# Patient Record
Sex: Female | Born: 1955 | Race: White | Hispanic: No | Marital: Married | Smoking: Never smoker
Health system: Southern US, Community
[De-identification: ages and names within clinical notes are randomized; demographics above are authoritative.]

## PROBLEM LIST (undated history)

## (undated) DIAGNOSIS — C439 Malignant melanoma of skin, unspecified: Secondary | ICD-10-CM

## (undated) DIAGNOSIS — D219 Benign neoplasm of connective and other soft tissue, unspecified: Secondary | ICD-10-CM

## (undated) DIAGNOSIS — E785 Hyperlipidemia, unspecified: Secondary | ICD-10-CM

## (undated) DIAGNOSIS — F419 Anxiety disorder, unspecified: Secondary | ICD-10-CM

## (undated) DIAGNOSIS — M1990 Unspecified osteoarthritis, unspecified site: Secondary | ICD-10-CM

## (undated) DIAGNOSIS — D649 Anemia, unspecified: Secondary | ICD-10-CM

## (undated) DIAGNOSIS — T7840XA Allergy, unspecified, initial encounter: Secondary | ICD-10-CM

## (undated) DIAGNOSIS — C4491 Basal cell carcinoma of skin, unspecified: Secondary | ICD-10-CM

## (undated) DIAGNOSIS — B029 Zoster without complications: Secondary | ICD-10-CM

## (undated) DIAGNOSIS — C801 Malignant (primary) neoplasm, unspecified: Secondary | ICD-10-CM

## (undated) DIAGNOSIS — Z86010 Personal history of colonic polyps: Secondary | ICD-10-CM

## (undated) DIAGNOSIS — C4492 Squamous cell carcinoma of skin, unspecified: Secondary | ICD-10-CM

## (undated) DIAGNOSIS — E109 Type 1 diabetes mellitus without complications: Secondary | ICD-10-CM

## (undated) DIAGNOSIS — I10 Essential (primary) hypertension: Secondary | ICD-10-CM

## (undated) HISTORY — DX: Malignant (primary) neoplasm, unspecified: C80.1

## (undated) HISTORY — DX: Allergy, unspecified, initial encounter: T78.40XA

## (undated) HISTORY — DX: Malignant melanoma of skin, unspecified: C43.9

## (undated) HISTORY — PX: COLONOSCOPY: SHX174

## (undated) HISTORY — PX: FOOT SURGERY: SHX648

## (undated) HISTORY — DX: Anxiety disorder, unspecified: F41.9

## (undated) HISTORY — PX: DILATION AND CURETTAGE OF UTERUS: SHX78

## (undated) HISTORY — PX: OTHER SURGICAL HISTORY: SHX169

## (undated) HISTORY — DX: Hyperlipidemia, unspecified: E78.5

## (undated) HISTORY — DX: Benign neoplasm of connective and other soft tissue, unspecified: D21.9

## (undated) HISTORY — DX: Unspecified osteoarthritis, unspecified site: M19.90

## (undated) HISTORY — DX: Essential (primary) hypertension: I10

## (undated) HISTORY — DX: Zoster without complications: B02.9

## (undated) HISTORY — PX: BREAST EXCISIONAL BIOPSY: SUR124

## (undated) HISTORY — DX: Personal history of colonic polyps: Z86.010

## (undated) HISTORY — DX: Anemia, unspecified: D64.9

## (undated) HISTORY — DX: Type 1 diabetes mellitus without complications: E10.9

## (undated) HISTORY — PX: TONSILLECTOMY AND ADENOIDECTOMY: SUR1326

---
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
## 1998-10-12 ENCOUNTER — Encounter: Payer: Self-pay | Admitting: *Deleted

## 1998-10-12 ENCOUNTER — Ambulatory Visit (HOSPITAL_COMMUNITY): Admission: RE | Admit: 1998-10-12 | Discharge: 1998-10-12 | Payer: Self-pay | Admitting: *Deleted

## 1999-12-27 ENCOUNTER — Other Ambulatory Visit: Admission: RE | Admit: 1999-12-27 | Discharge: 1999-12-27 | Payer: Self-pay | Admitting: *Deleted

## 2000-02-07 ENCOUNTER — Ambulatory Visit (HOSPITAL_COMMUNITY): Admission: RE | Admit: 2000-02-07 | Discharge: 2000-02-07 | Payer: Self-pay | Admitting: *Deleted

## 2000-02-07 ENCOUNTER — Encounter: Payer: Self-pay | Admitting: *Deleted

## 2000-10-14 ENCOUNTER — Encounter: Admission: RE | Admit: 2000-10-14 | Discharge: 2001-01-12 | Payer: Self-pay | Admitting: Internal Medicine

## 2001-02-07 ENCOUNTER — Encounter: Payer: Self-pay | Admitting: *Deleted

## 2001-02-07 ENCOUNTER — Ambulatory Visit (HOSPITAL_COMMUNITY): Admission: RE | Admit: 2001-02-07 | Discharge: 2001-02-07 | Payer: Self-pay | Admitting: *Deleted

## 2001-09-11 ENCOUNTER — Other Ambulatory Visit: Admission: RE | Admit: 2001-09-11 | Discharge: 2001-09-11 | Payer: Self-pay | Admitting: *Deleted

## 2002-07-08 ENCOUNTER — Ambulatory Visit (HOSPITAL_COMMUNITY): Admission: RE | Admit: 2002-07-08 | Discharge: 2002-07-08 | Payer: Self-pay | Admitting: *Deleted

## 2002-07-08 ENCOUNTER — Encounter: Payer: Self-pay | Admitting: *Deleted

## 2003-05-05 ENCOUNTER — Other Ambulatory Visit: Admission: RE | Admit: 2003-05-05 | Discharge: 2003-05-05 | Payer: Self-pay | Admitting: *Deleted

## 2003-05-17 ENCOUNTER — Encounter: Admission: RE | Admit: 2003-05-17 | Discharge: 2003-05-17 | Payer: Self-pay | Admitting: *Deleted

## 2004-03-14 ENCOUNTER — Ambulatory Visit: Payer: Self-pay | Admitting: Internal Medicine

## 2004-08-15 ENCOUNTER — Encounter: Admission: RE | Admit: 2004-08-15 | Discharge: 2004-08-15 | Payer: Self-pay | Admitting: *Deleted

## 2004-12-14 ENCOUNTER — Other Ambulatory Visit: Admission: RE | Admit: 2004-12-14 | Discharge: 2004-12-14 | Payer: Self-pay | Admitting: *Deleted

## 2005-10-30 ENCOUNTER — Encounter: Admission: RE | Admit: 2005-10-30 | Discharge: 2005-10-30 | Payer: Self-pay | Admitting: Obstetrics and Gynecology

## 2006-02-18 ENCOUNTER — Other Ambulatory Visit: Admission: RE | Admit: 2006-02-18 | Discharge: 2006-02-18 | Payer: Self-pay | Admitting: Obstetrics & Gynecology

## 2006-11-11 ENCOUNTER — Encounter: Admission: RE | Admit: 2006-11-11 | Discharge: 2006-11-11 | Payer: Self-pay | Admitting: Obstetrics & Gynecology

## 2007-06-19 ENCOUNTER — Other Ambulatory Visit: Admission: RE | Admit: 2007-06-19 | Discharge: 2007-06-19 | Payer: Self-pay | Admitting: Obstetrics & Gynecology

## 2007-11-12 ENCOUNTER — Ambulatory Visit (HOSPITAL_BASED_OUTPATIENT_CLINIC_OR_DEPARTMENT_OTHER): Admission: RE | Admit: 2007-11-12 | Discharge: 2007-11-12 | Payer: Self-pay | Admitting: Obstetrics & Gynecology

## 2008-08-02 ENCOUNTER — Other Ambulatory Visit: Admission: RE | Admit: 2008-08-02 | Discharge: 2008-08-02 | Payer: Self-pay | Admitting: Obstetrics & Gynecology

## 2008-11-17 ENCOUNTER — Ambulatory Visit (HOSPITAL_BASED_OUTPATIENT_CLINIC_OR_DEPARTMENT_OTHER): Admission: RE | Admit: 2008-11-17 | Discharge: 2008-11-17 | Payer: Self-pay | Admitting: Obstetrics & Gynecology

## 2008-11-17 ENCOUNTER — Ambulatory Visit: Payer: Self-pay | Admitting: Radiology

## 2009-01-27 DIAGNOSIS — E7800 Pure hypercholesterolemia, unspecified: Secondary | ICD-10-CM | POA: Insufficient documentation

## 2009-01-27 DIAGNOSIS — F419 Anxiety disorder, unspecified: Secondary | ICD-10-CM | POA: Insufficient documentation

## 2011-05-02 ENCOUNTER — Encounter: Payer: Self-pay | Admitting: Internal Medicine

## 2012-01-23 ENCOUNTER — Encounter: Payer: Self-pay | Admitting: Internal Medicine

## 2012-03-14 DIAGNOSIS — Z860101 Personal history of adenomatous and serrated colon polyps: Secondary | ICD-10-CM | POA: Insufficient documentation

## 2012-03-14 DIAGNOSIS — Z86010 Personal history of colonic polyps: Secondary | ICD-10-CM

## 2012-03-14 HISTORY — DX: Personal history of adenomatous and serrated colon polyps: Z86.0101

## 2012-03-14 HISTORY — DX: Personal history of colonic polyps: Z86.010

## 2014-12-29 DIAGNOSIS — D126 Benign neoplasm of colon, unspecified: Secondary | ICD-10-CM | POA: Insufficient documentation

## 2015-02-16 ENCOUNTER — Telehealth: Payer: Self-pay | Admitting: Internal Medicine

## 2015-02-16 NOTE — Telephone Encounter (Signed)
Rec'd from [facility] forward 9 pages to Dr.Gessner

## 2015-02-18 ENCOUNTER — Telehealth: Payer: Self-pay | Admitting: Internal Medicine

## 2015-02-18 NOTE — Telephone Encounter (Signed)
Rec'd from Dr. Debby Bud forward 9 pages to Dr.Gessner

## 2015-03-07 ENCOUNTER — Ambulatory Visit (INDEPENDENT_AMBULATORY_CARE_PROVIDER_SITE_OTHER): Payer: BC Managed Care – PPO | Admitting: Obstetrics and Gynecology

## 2015-03-07 ENCOUNTER — Encounter: Payer: Self-pay | Admitting: Obstetrics and Gynecology

## 2015-03-07 VITALS — BP 140/66 | HR 78 | Resp 16 | Ht 64.75 in | Wt 177.0 lb

## 2015-03-07 DIAGNOSIS — N951 Menopausal and female climacteric states: Secondary | ICD-10-CM | POA: Diagnosis not present

## 2015-03-07 DIAGNOSIS — E109 Type 1 diabetes mellitus without complications: Secondary | ICD-10-CM | POA: Insufficient documentation

## 2015-03-07 DIAGNOSIS — I10 Essential (primary) hypertension: Secondary | ICD-10-CM | POA: Insufficient documentation

## 2015-03-07 DIAGNOSIS — Z01419 Encounter for gynecological examination (general) (routine) without abnormal findings: Secondary | ICD-10-CM

## 2015-03-07 NOTE — Progress Notes (Signed)
59 y.o. G0P0000 MarriedCaucasianF here for annual exam.  The patient had an ultrasound last year, endometrial stripe was 8.3 mm, no bleeding. No bleeding since 2012. Not sexually active, husband is quadraplegic (since 78). She has some hot flashes, worse in the last year, mostly tolerable. She doesn't sleep well, doesn't want sleeping pills, needs to be able to help her husband.  Type 1 diabetic, diagnosed at 61. Last HgbA1C was 8.1 (this is the best it has been in a while).     Patient's last menstrual period was 03/31/2011.          Sexually active: No.  The current method of family planning is post menopausal status.    Exercising: Yes.    Walking Smoker:  no  Health Maintenance: Pap:  12/15/13 Neg. HR HPV:neg History of abnormal Pap:  no MMG:  01/2014 fatty nodules. Due now Colonoscopy:  2013. Due now   BMD:   Never TDaP:  UTD   reports that she has never smoked. She has never used smokeless tobacco. She reports that she drinks about 4.2 oz of alcohol per week. She reports that she does not use illicit drugs.She drinks a glass of wine a day. 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 Husband has been laid off. He is a quadraplegic (since she met him), she is his caregiver.   Past Medical History  Diagnosis Date  . Fibroid   . Diabetes mellitus type 1 ([place])     age 89  . Hypertension     Past Surgical History  Procedure Laterality Date  . Dilation and curettage of uterus    . Tonsillectomy and adenoidectomy    . Foot surgery Left age 24    Current Outpatient Prescriptions  Medication Sig Dispense Refill  . aspirin 81 MG tablet Take 81 mg by mouth daily.    . carvedilol (COREG) 6.25 MG tablet Take 6.25 mg by mouth 2 (two) times daily with a meal.    . celecoxib (CELEBREX) 200 MG capsule Take 200 mg by mouth daily.    . hydroxychloroquine (PLAQUENIL) 200 MG tablet Take 200 mg by mouth 2 (two) times daily.     . insulin lispro (HUMALOG) 100 UNIT/ML injection  Inject into the skin 3 (three) times daily before meals.    Marland Kitchen L-Methylfolate-B6-B12 (METANX PO) Take by mouth daily.    Marland Kitchen losartan-hydrochlorothiazide (HYZAAR) 50-12.5 MG tablet Take 1 tablet by mouth daily.    . simvastatin (ZOCOR) 40 MG tablet Take 40 mg by mouth daily.     No current facility-administered medications for this visit.  Plaquenil for "swelling in the tendons"  Family History  Problem Relation Age of Onset  . Breast cancer Mother   . Breast cancer Maternal Aunt   Mom with breast cancer in her 73's, Maunt in her 33's. Both are alive and in their 36's  Review of Systems  Endocrine: Positive for cold intolerance and heat intolerance.  All other systems reviewed and are negative.   Exam:   BP 140/66 mmHg  Pulse 78  Resp 16  Ht 5' 4.75" (1.645 m)  Wt 177 lb (80.287 kg)  BMI 29.67 kg/m2  LMP 03/31/2011  Weight change: _0 @ Height:   Height: 5' 4.75" (164.5 cm)  Ht Readings from Last 3 Encounters:  03/07/15 5' 4.75" (1.645 m)    General appearance: alert, cooperative and appears stated age Head: Normocephalic, without obvious abnormality, atraumatic Neck: no adenopathy, supple, symmetrical, trachea midline and thyroid normal  to inspection and palpation Lungs: clear to auscultation bilaterally Breasts: normal appearance, no masses or tenderness Heart: regular rate and rhythm Abdomen: soft, non-tender; bowel sounds normal; no masses,  no organomegaly Extremities: extremities normal, atraumatic, no cyanosis or edema Skin: Skin color, texture, turgor normal. No rashes or lesions Lymph nodes: Cervical, supraclavicular, and axillary nodes normal. No abnormal inguinal nodes palpated Neurologic: Grossly normal   Pelvic: External genitalia:  no lesions              Urethra:  normal appearing urethra with no masses, tenderness or lesions              Bartholins and Skenes: normal                 Vagina: normal appearing vagina with normal color and discharge,  no lesions. Very atrophic              Cervix: no lesions               Bimanual Exam:  Uterus:  normal size, contour, position, consistency, mobility, non-tender              Adnexa: no mass, fullness, tenderness               Rectovaginal: Confirms               Anus:  normal sphincter tone, no lesions  Chaperone was present for exam.  A:  Well Woman with normal exam  Vasomotor symptoms    P:   No pap needed  Mammogram  Colonoscopy due this year, has an appointment  Labs and immunizations with her primary  Discussed breast self exams  Was told to stop calcium supplements, will check her vit D with primary  Discussed behavioral methods to handle vasomotor symptoms, can try [employer]

## 2015-03-07 NOTE — Patient Instructions (Signed)

## 2015-03-10 ENCOUNTER — Other Ambulatory Visit: Payer: Self-pay

## 2015-03-26 DIAGNOSIS — H269 Unspecified cataract: Secondary | ICD-10-CM | POA: Insufficient documentation

## 2015-04-21 ENCOUNTER — Telehealth: Payer: Self-pay

## 2015-04-21 NOTE — Telephone Encounter (Signed)
Left message for patient to call me. Per Dr Carlean Purl after reviewing her records from Dr Debby Bud in [facility] her recall for a colonoscopy will be Nov. 2018.  She has an appointment with Dr Carlean Purl on 05/10/2015 which we can cancel unless she has more issues.

## 2015-05-04 ENCOUNTER — Other Ambulatory Visit: Payer: Self-pay | Admitting: Obstetrics and Gynecology

## 2015-05-04 DIAGNOSIS — Z1231 Encounter for screening mammogram for malignant neoplasm of breast: Secondary | ICD-10-CM

## 2015-05-04 NOTE — Telephone Encounter (Signed)
Left patient to call me as she may not need her appointment next week.

## 2015-05-06 NOTE — Telephone Encounter (Signed)
Reached patient and she was informed of the new colon recall date.  She is not having any issues so we have cancelled her 05/10/2015 appointment with Dr Carlean Purl.

## 2015-05-10 ENCOUNTER — Ambulatory Visit: Payer: Self-pay | Admitting: Internal Medicine

## 2015-05-12 ENCOUNTER — Other Ambulatory Visit: Payer: Self-pay | Admitting: Obstetrics and Gynecology

## 2015-05-12 ENCOUNTER — Ambulatory Visit (HOSPITAL_BASED_OUTPATIENT_CLINIC_OR_DEPARTMENT_OTHER)
Admission: RE | Admit: 2015-05-12 | Discharge: 2015-05-12 | Disposition: A | Discharge status: Home / Self Care | Payer: BC Managed Care – PPO | Source: Ambulatory Visit | Attending: Obstetrics and Gynecology | Admitting: Obstetrics and Gynecology

## 2015-05-12 DIAGNOSIS — Z1231 Encounter for screening mammogram for malignant neoplasm of breast: Secondary | ICD-10-CM | POA: Diagnosis present

## 2015-05-13 ENCOUNTER — Ambulatory Visit (HOSPITAL_BASED_OUTPATIENT_CLINIC_OR_DEPARTMENT_OTHER): Payer: Self-pay

## 2016-01-18 DIAGNOSIS — M25519 Pain in unspecified shoulder: Secondary | ICD-10-CM | POA: Insufficient documentation

## 2016-03-12 ENCOUNTER — Encounter: Payer: Self-pay | Admitting: Obstetrics and Gynecology

## 2016-03-12 ENCOUNTER — Ambulatory Visit (INDEPENDENT_AMBULATORY_CARE_PROVIDER_SITE_OTHER): Payer: BC Managed Care – PPO | Admitting: Obstetrics and Gynecology

## 2016-03-12 VITALS — BP 128/70 | HR 80 | Resp 16 | Ht 65.5 in | Wt 180.6 lb

## 2016-03-12 DIAGNOSIS — Z01419 Encounter for gynecological examination (general) (routine) without abnormal findings: Secondary | ICD-10-CM

## 2016-03-12 DIAGNOSIS — L918 Other hypertrophic disorders of the skin: Secondary | ICD-10-CM

## 2016-03-12 NOTE — Patient Instructions (Signed)

## 2016-03-12 NOTE — Progress Notes (Signed)
60 y.o. G0P0000 MarriedCaucasianF here for annual exam.  No vaginal bleeding. She has diabetes, HgbA1C was 8% at last check. No medical changes.  She has a skin tag in her left groin. Rubs on her underwear, annoying.     Patient's last menstrual period was 03/31/2011.          Sexually active: No.  The current method of family planning is post menopausal status.    Exercising: Yes.    walking Smoker:  no  Health Maintenance: Pap:  12/15/13 Neg. HR HPV:neg History of abnormal Pap:  no MMG:  05/12/15 BIRADS 1 negative Colonoscopy:  2013 due 2018 BMD:   never TDaP:  UTD   reports that she has never smoked. She has never used smokeless tobacco. She reports that she drinks about 4.2 oz of alcohol per week . She reports that she does not use drugs.Husband is a quadriplegic (since 39). 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 Diabetes mellitus type 1 ([place])    age 64  . Fibroid   . Hypertension     Past Surgical History:  Procedure Laterality Date  . DILATION AND CURETTAGE OF UTERUS    . FOOT SURGERY Left age 29  . TONSILLECTOMY AND ADENOIDECTOMY      Current Outpatient Prescriptions  Medication Sig Dispense Refill  . aspirin 81 MG tablet Take 81 mg by mouth daily.    . carvedilol (COREG) 6.25 MG tablet Take 6.25 mg by mouth 2 (two) times daily with a meal.    . celecoxib (CELEBREX) 200 MG capsule Take 200 mg by mouth daily.    . hydroxychloroquine (PLAQUENIL) 200 MG tablet Take 200 mg by mouth 2 (two) times daily.     Marland Kitchen L-Methylfolate-B6-B12 (METANX PO) Take by mouth daily.    Marland Kitchen losartan-hydrochlorothiazide (HYZAAR) 50-12.5 MG tablet Take 1 tablet by mouth daily.    Marland Kitchen NOVOLOG 100 UNIT/ML injection Insulin pump    . simvastatin (ZOCOR) 40 MG tablet Take 40 mg by mouth daily.     No current facility-administered medications for this visit.     Family History  Problem Relation Age of Onset  . Breast cancer Mother 10  .  Breast cancer Maternal Aunt 70    Review of Systems  Constitutional: Negative.   HENT: Negative.   Eyes: Negative.   Respiratory: Negative.   Cardiovascular: Negative.   Gastrointestinal:       Change in quality/character of stools  Endocrine: Positive for cold intolerance and heat intolerance.  Genitourinary: Negative.   Musculoskeletal:       Muscle or joint pain  Skin: Positive for rash.  Allergic/Immunologic: Negative.   Neurological: Negative.   Hematological: Negative.   Psychiatric/Behavioral: Negative.     Exam:   BP 128/70 (BP Location: Right Arm, Patient Position: Sitting, Cuff Size: Normal)   Pulse 80   Resp 16   Ht 5' 5.5" (1.664 m)   Wt 180 lb 9.6 oz (81.9 kg)   LMP 03/31/2011   BMI 29.60 kg/m   Weight change: @WEIGHTCHANGE @ Height:   Height: 5' 5.5" (166.4 cm)  Ht Readings from Last 3 Encounters:  03/12/16 5' 5.5" (1.664 m)  03/07/15 5' 4.75" (1.645 m)    General appearance: alert, cooperative and appears stated age Head: Normocephalic, without obvious abnormality, atraumatic Neck: no adenopathy, supple, symmetrical, trachea midline and thyroid normal to inspection and palpation Lungs: clear to auscultation bilaterally Breasts: normal appearance, no  masses or tenderness Heart: regular rate and rhythm Abdomen: soft, non-tender; bowel sounds normal; no masses,  no organomegaly Extremities: extremities normal, atraumatic, no cyanosis or edema Skin: Skin color, texture, turgor normal. No rashes or lesions Lymph nodes: Cervical, supraclavicular, and axillary nodes normal. No abnormal inguinal nodes palpated Neurologic: Grossly normal   Pelvic: External genitalia:  Skin tag in her left groin, irritated.               Urethra:  normal appearing urethra with no masses, tenderness or lesions              Bartholins and Skenes: normal                 Vagina: normal appearing vagina with normal color and discharge, no lesions              Cervix: no lesions                Bimanual Exam:  Uterus:  normal size, contour, position, consistency, mobility, non-tender              Adnexa: no mass, fullness, tenderness               Rectovaginal: Confirms               Anus:  normal sphincter tone, no lesions  Chaperone was present for exam.  A:  Well Woman with normal exam  Irritating skin tag, may return for removal  P:   No pap this year  Discussed breast self exam  Discussed calcium and vit D intake  Labs with primary MD  Mammogram in 1/18  Colonoscopy next year

## 2016-05-10 DIAGNOSIS — H16223 Keratoconjunctivitis sicca, not specified as Sjogren's, bilateral: Secondary | ICD-10-CM | POA: Insufficient documentation

## 2016-05-10 DIAGNOSIS — H5213 Myopia, bilateral: Secondary | ICD-10-CM | POA: Insufficient documentation

## 2016-05-10 DIAGNOSIS — H2513 Age-related nuclear cataract, bilateral: Secondary | ICD-10-CM | POA: Insufficient documentation

## 2016-06-08 ENCOUNTER — Other Ambulatory Visit: Payer: Self-pay | Admitting: Obstetrics and Gynecology

## 2016-06-08 DIAGNOSIS — Z1231 Encounter for screening mammogram for malignant neoplasm of breast: Secondary | ICD-10-CM

## 2016-06-12 ENCOUNTER — Ambulatory Visit (HOSPITAL_BASED_OUTPATIENT_CLINIC_OR_DEPARTMENT_OTHER): Payer: BC Managed Care – PPO

## 2016-06-21 ENCOUNTER — Ambulatory Visit (HOSPITAL_BASED_OUTPATIENT_CLINIC_OR_DEPARTMENT_OTHER)
Admission: RE | Admit: 2016-06-21 | Discharge: 2016-06-21 | Disposition: A | Discharge status: Home / Self Care | Payer: BC Managed Care – PPO | Source: Ambulatory Visit | Attending: Obstetrics and Gynecology | Admitting: Obstetrics and Gynecology

## 2016-06-21 DIAGNOSIS — Z1231 Encounter for screening mammogram for malignant neoplasm of breast: Secondary | ICD-10-CM | POA: Insufficient documentation

## 2016-08-07 ENCOUNTER — Ambulatory Visit: Payer: BC Managed Care – PPO | Admitting: Podiatry

## 2016-08-14 ENCOUNTER — Encounter: Payer: Self-pay | Admitting: Podiatry

## 2016-08-14 ENCOUNTER — Ambulatory Visit (INDEPENDENT_AMBULATORY_CARE_PROVIDER_SITE_OTHER): Payer: BC Managed Care – PPO | Admitting: Podiatry

## 2016-08-14 ENCOUNTER — Ambulatory Visit (HOSPITAL_BASED_OUTPATIENT_CLINIC_OR_DEPARTMENT_OTHER)
Admission: RE | Admit: 2016-08-14 | Discharge: 2016-08-14 | Disposition: A | Discharge status: Home / Self Care | Payer: BC Managed Care – PPO | Source: Ambulatory Visit | Attending: Podiatry | Admitting: Podiatry

## 2016-08-14 DIAGNOSIS — M84361A Stress fracture, right tibia, initial encounter for fracture: Secondary | ICD-10-CM

## 2016-08-14 DIAGNOSIS — B070 Plantar wart: Secondary | ICD-10-CM | POA: Diagnosis not present

## 2016-08-16 ENCOUNTER — Telehealth: Payer: Self-pay | Admitting: *Deleted

## 2016-08-16 NOTE — Telephone Encounter (Addendum)
-----   Message from Trula Slade, DPM sent at 08/15/2016 10:07 AM EDT ----- X-ray of her right leg is negative. Please let her know.08/16/2016-Left message informing pt of Dr. Leigh Aurora review of x-rays.

## 2016-08-18 NOTE — Progress Notes (Signed)
Subjective:     Patient ID: Christiana Pellant, female   DOB: 1955/11/24, 61 y.o.   MRN: [id number]  HPI 61 year old female presents the office they for concerns of calluses or possible warts to both of her feet. States his been ongoing for several years. She states that she's had previous treatment by other physicians but has been several years. She states the areas painful times with pressure in shoes. She denies any redness or drainage or any swelling. She'll consider last couple weeks she has had some pain and she points the right leg when walking. The pain has started to get a little bit worse over time she denies any recent injury or trauma. Denies any swelling or redness. She has no other complaints today.  Review of Systems  All other systems reviewed and are negative.      Objective:   Physical Exam General: AAO x3, NAD  Dermatological: Multiple annular hyperkeratotic lesions and upon debridement there is pinpoint bleeding evidence of verruca. This is the left heel 3 as well as left hallux 2. There is no underlying source identified there is no drainage or pus otherwise. There is no swelling erythema, ascending synovitis. There is no open lesions or pre-ulcer lesions identified otherwise.  Vascular: Dorsalis Pedis artery and Posterior Tibial artery pedal pulses are 2/4 bilateral with immedate capillary fill time. There is no pain with calf compression, swelling, warmth, erythema.   Neruologic: Grossly intact via light touch bilateral. Vibratory intact via tuning fork bilateral. Protective threshold with Semmes Wienstein monofilament intact to all pedal sites bilateral.   Musculoskeletal: There is tenderness the right tibia along the tibial crest on the right side about mid tibia. There is no pain vibratory sensation is no overlying edema, erythema, increase in warmth. She does state this area is painful with walking however. There are no other areas of tenderness identified at this time.  Muscular strength 5/5 in all groups tested bilateral.  Gait: Unassisted, Nonantalgic.      Assessment:     61 year old female left foot verruca, right leg pain, rule out stress fracture    Plan:     -Treatment options discussed including all alternatives, risks, and complications -Etiology of symptoms were discussed -Right tib-fib x-rays were ordered. Discussed also be muscular in nature. -Lesions in the left foot were debrided without complications or bleeding. Lesions were cleaned and a pad was placed followed by silvadene was applied followed by an occlusive bandage. This procedure tractions were discussed. I will compound cream for verruca as well today through [facility].  -Follow-up in 4 weeks or sooner if needed.  Celesta Gentile, DPM

## 2016-09-25 ENCOUNTER — Encounter: Payer: Self-pay | Admitting: Podiatry

## 2016-09-25 ENCOUNTER — Ambulatory Visit (INDEPENDENT_AMBULATORY_CARE_PROVIDER_SITE_OTHER): Payer: BC Managed Care – PPO | Admitting: Podiatry

## 2016-09-25 DIAGNOSIS — B070 Plantar wart: Secondary | ICD-10-CM | POA: Diagnosis not present

## 2016-09-25 NOTE — Progress Notes (Signed)
Subjective: 61 year old female presents the office they for follow-up evaluation of verruca to her left foot. She states that she's been using the compound cream that I ordered she has noticed some mild improvement. She has not been filing with a pumice stone. She states the pain in the right leg is also improved. She denies any increase in swelling or redness or any warmth to her feet. She has no new concerns today. Denies any systemic complaints such as fevers, chills, nausea, vomiting. No acute changes since last appointment, and no other complaints at this time.   Objective: AAO x3, NAD DP/PT pulses palpable bilaterally, CRT less than 3 seconds Multiple annular hyperkeratotic lesions present on the plantar aspect left hallux as well as left posterior plantar heel. Upon debridement there is pinpoint bleeding evidence of verruca. Overall the decrease in the more superficial they do continue. There is no pain to the areas and there is no surrounding redness or drainage. There is no area pinpoint bony tenderness bilaterally. There is no overlying edema, erythema, increase in warmth bilaterally. Pain to the anterolateral of right leg is improved  No open lesions or pre-ulcerative lesions.  No pain with calf compression, swelling, warmth, erythema  Assessment: Left foot verruca, right leg pain improved  Plan: -All treatment options discussed with the patient including all alternatives, risks, complications.  -Discussed with the right leg pain continues or orthopedics for sports medicine follow-up. -The verruca on the left lower shoulder debrided without complications or bleeding. The areas were cleaned. A pad was placed followed of by salicylic acid and a bandage. Post procedure instructions were discussed. -RTC 3-4 weeks or sooner if needed.  -Patient encouraged to call the office with any questions, concerns, change in symptoms.   Celesta Gentile, DPM

## 2016-10-23 ENCOUNTER — Ambulatory Visit: Payer: BC Managed Care – PPO | Admitting: Podiatry

## 2016-12-04 ENCOUNTER — Encounter (INDEPENDENT_AMBULATORY_CARE_PROVIDER_SITE_OTHER): Payer: Self-pay

## 2016-12-04 ENCOUNTER — Encounter: Payer: Self-pay | Admitting: Podiatry

## 2016-12-04 ENCOUNTER — Ambulatory Visit (INDEPENDENT_AMBULATORY_CARE_PROVIDER_SITE_OTHER): Payer: BC Managed Care – PPO | Admitting: Podiatry

## 2016-12-04 DIAGNOSIS — M799 Soft tissue disorder, unspecified: Secondary | ICD-10-CM | POA: Diagnosis not present

## 2016-12-04 DIAGNOSIS — B070 Plantar wart: Secondary | ICD-10-CM | POA: Diagnosis not present

## 2016-12-04 DIAGNOSIS — M7989 Other specified soft tissue disorders: Secondary | ICD-10-CM

## 2016-12-04 MED ORDER — FLUOROURACIL 5 % EX CREA: TOPICAL_CREAM | Freq: Two times a day (BID) | CUTANEOUS | 0 refills | Status: DC

## 2016-12-04 NOTE — Progress Notes (Signed)
Subjective: Julia Nash presents the office today with her husband for follow-up evaluation of warts the left foot. She said they were doing better however they've started to come back. She does use the compound cream daily basis. She denies any pain or redness or any swelling. She states of the left leg is still bothering her. This was doing better but when she was in [place] the last several weeks she is having to lift her husband and transfer him and she is getting pain. She has noticed a lump form on the inside aspect of her left leg above the ankle. She says the pain has gotten better since she has returned and she's not been having digit transfer husband. No redness or swelling that she has noticed. Denies any systemic complaints such as fevers, chills, nausea, vomiting. No acute changes since last appointment, and no other complaints at this time.   The worsening ongoing for some time and she has seen several other doctors about this.  Objective: AAO x3, NAD DP/PT pulses palpable bilaterally, CRT less than 3 seconds Multiple verruca are present to the left hallux as well as the left heel. There is no ascending erythema, ascending synovitis. No fluctuance or crepitus there is no malodor. There is no clinical signs of infection present. On the medial aspect of left leg appears to be soft tissue mass palpable. Minimal pain and there is no edema, erythema. There is no area pinpoint bony tenderness.  No open lesions or pre-ulcerative lesions.  No pain with calf compression, swelling, warmth, erythema  Assessment: 61 year old female with verruca ; soft tissue mass left leg  Plan: -All treatment options discussed with the patient including all alternatives, risks, complications.  -Will switch to efudex cream for the warts and directed on use as well as duration. I sharply debrided today to confirm warts. No ulceration or signs of infection. If warts continue discussed possible laser therapy. -Given the  mass the left leg at order an ultrasound to this area. She was has not had any imaging done diabetic. An ultrasound. This was continually refer to sports medicine for another opinion.  -Patient encouraged to call the office with any questions, concerns, change in symptoms.   Celesta Gentile, DPM

## 2016-12-05 ENCOUNTER — Ambulatory Visit (HOSPITAL_BASED_OUTPATIENT_CLINIC_OR_DEPARTMENT_OTHER)
Admission: RE | Admit: 2016-12-05 | Discharge: 2016-12-05 | Disposition: A | Discharge status: Home / Self Care | Payer: BC Managed Care – PPO | Source: Ambulatory Visit | Attending: Podiatry | Admitting: Podiatry

## 2016-12-05 ENCOUNTER — Telehealth: Payer: Self-pay | Admitting: *Deleted

## 2016-12-05 DIAGNOSIS — M799 Soft tissue disorder, unspecified: Secondary | ICD-10-CM | POA: Insufficient documentation

## 2016-12-05 DIAGNOSIS — M7989 Other specified soft tissue disorders: Secondary | ICD-10-CM

## 2016-12-05 NOTE — Telephone Encounter (Addendum)
-----   Message from Trula Slade, DPM sent at 12/04/2016  9:51 AM EDT ----- Can you please order a diagnostic ultrasound of left leg, medial side. She has a soft tissue mass that would like further evaluation. 12/05/2016-Orders faxed to [facility].

## 2016-12-26 ENCOUNTER — Encounter: Payer: Self-pay | Admitting: Podiatry

## 2017-01-01 ENCOUNTER — Ambulatory Visit (INDEPENDENT_AMBULATORY_CARE_PROVIDER_SITE_OTHER): Payer: BC Managed Care – PPO | Admitting: Podiatry

## 2017-01-01 ENCOUNTER — Encounter: Payer: Self-pay | Admitting: Podiatry

## 2017-01-01 DIAGNOSIS — B070 Plantar wart: Secondary | ICD-10-CM

## 2017-01-01 DIAGNOSIS — M799 Soft tissue disorder, unspecified: Secondary | ICD-10-CM

## 2017-01-01 DIAGNOSIS — M7989 Other specified soft tissue disorders: Secondary | ICD-10-CM

## 2017-01-02 NOTE — Progress Notes (Signed)
Subjective: Julia Nash presents the office today for follow violation of warts to the left foot which is been ongoing for several years as well as to discuss ultrasound results of her left leg. She states that the leg pain is doing much better and she has not felt the soft tissue mass recently. She states that she's been doing water aerobics which is been helping with this. She states that the warts are about the same. She states that sometimes IT is doing better than he gets worse again but overall they're given the same. Denies any swelling or drainage or any redness to her feet. Denies any systemic complaints such as fevers, chills, nausea, vomiting. No acute changes since last appointment, and no other complaints at this time.   Objective: AAO x3, NAD DP/PT pulses palpable bilaterally, CRT less than 3 seconds Multiple verruca present left foot mostly on the left hallux as well as a left heel. There is no surrounding erythema, drainage or any clinical signs of infection. Is no pain to the area. On the medial aspect of the left leg very small soft tissue mass is palpable and this is much improved. There is no pain to the area again there is no skin change or swelling or discoloration of the skin. There is no pain in this area today. No other areas of tenderness. No open lesions or pre-ulcerative lesions.  No pain with calf compression, swelling, warmth, erythema  Assessment: Lipoma left leg with verruca  Plan: -All treatment options discussed with the patient including all alternatives, risks, complications.  -Discussed ultrasound results the left side which reveal subcutaneous fat likely result of lipoma. She's having no pain in this area. -Continue with stretching, rehabilitation as is his been helping. In regards to the warts is his been chronic this point she is tried numerous treatments for this. I discussed with her laser therapy and she would consider this. I discussed other surgical  consultation by Dr. Cannon Kettle in [place] to see if she is a candidate for laser therapy. -Patient encouraged to call the office with any questions, concerns, change in symptoms.   Julia Nash, DPM

## 2017-01-29 ENCOUNTER — Encounter: Payer: Self-pay | Admitting: Sports Medicine

## 2017-01-29 ENCOUNTER — Ambulatory Visit (INDEPENDENT_AMBULATORY_CARE_PROVIDER_SITE_OTHER): Payer: BC Managed Care – PPO | Admitting: Sports Medicine

## 2017-01-29 DIAGNOSIS — B070 Plantar wart: Secondary | ICD-10-CM

## 2017-01-29 DIAGNOSIS — E1042 Type 1 diabetes mellitus with diabetic polyneuropathy: Secondary | ICD-10-CM | POA: Diagnosis not present

## 2017-01-29 NOTE — Patient Instructions (Signed)
Pre-Operative Instructions  Congratulations, you have decided to take an important step towards improving your quality of life.  You can be assured that the doctors and staff at [facility] will be with you every step of the way.  Here are some important things you should know:  1. Plan to be at the surgery center/hospital at least 1 (one) hour prior to your scheduled time, unless otherwise directed by the surgical center/hospital staff.  You must have a responsible adult accompany you, remain during the surgery and drive you home.  Make sure you have directions to the surgical center/hospital to ensure you arrive on time. 2. If you are having surgery at [facility] or [facility], you will need a copy of your medical history and physical form from your family physician within one month prior to the date of surgery. We will give you a form for your primary physician to complete.  3. We make every effort to accommodate the date you request for surgery.  However, there are times where surgery dates or times have to be moved.  We will contact you as soon as possible if a change in schedule is required.   4. No aspirin/ibuprofen for one week before surgery.  If you are on aspirin, any non-steroidal anti-inflammatory medications (Mobic, Aleve, Ibuprofen) should not be taken seven (7) days prior to your surgery.  You make take Tylenol for pain prior to surgery.  5. Medications - If you are taking daily heart and blood pressure medications, seizure, reflux, allergy, asthma, anxiety, pain or diabetes medications, make sure you notify the surgery center/hospital before the day of surgery so they can tell you which medications you should take or avoid the day of surgery. 6. No food or drink after midnight the night before surgery unless directed otherwise by surgical center/hospital staff. 7. No alcoholic beverages 24-hours prior to surgery.  No smoking 24-hours prior or 24-hours after  surgery. 8. Wear loose pants or shorts. They should be loose enough to fit over bandages, boots, and casts. 9. Don't wear slip-on shoes. Sneakers are preferred. 10. Bring your boot with you to the surgery center/hospital.  Also bring crutches or a walker if your physician has prescribed it for you.  If you do not have this equipment, it will be provided for you after surgery. 11. If you have not been contacted by the surgery center/hospital by the day before your surgery, call to confirm the date and time of your surgery. 12. Leave-time from work may vary depending on the type of surgery you have.  Appropriate arrangements should be made prior to surgery with your employer. 13. Prescriptions will be provided immediately following surgery by your doctor.  Fill these as soon as possible after surgery and take the medication as directed. Pain medications will not be refilled on weekends and must be approved by the doctor. 14. Remove nail polish on the operative foot and avoid getting pedicures prior to surgery. 15. Wash the night before surgery.  The night before surgery wash the foot and leg well with water and the antibacterial soap provided. Be sure to pay special attention to beneath the toenails and in between the toes.  Wash for at least three (3) minutes. Rinse thoroughly with water and dry well with a towel.  Perform this wash unless told not to do so by your physician.  Enclosed: 1 Ice pack (please put in freezer the night before surgery)   1 Hibiclens skin cleaner     Pre-op instructions  If you have any questions regarding the instructions, please do not hesitate to call our office.  [place]: [address], [place], [place] [address] -- [phone]  [place]: [address] [place], [place] [address] -- [phone]  [place]: [address]  , [place] [address] -- [phone]  [place]: [address], [address], [place],  [address] -- [phone]  Website:  [email or web address] 

## 2017-01-29 NOTE — Progress Notes (Signed)
Subjective: Julia Nash is a 61 y.o. female patient who presents to office for evaluation of Left>RIght foot pain secondary to warts. Patient has tried multiple treatments with no relief in symptoms. Patient was referred by Dr. Jacqualyn Posey for consideration for laser since nothing has worked for the warts. States that these have been present for > 10 years. Patient enjoys swimming during the summertime but is concerned because warts just keep coming back. Patient admits to a history of diabetes. Sugar right now 133, last A1c 8. Admits to some tingling and numbness but otherwise no other symptoms in feet. Patient denies any other pedal complaints.   Patient Active Problem List   Diagnosis Date Noted  . Plantar wart 09/25/2016  . Diabetes mellitus type 1 ([place])   . Hypertension     Current Outpatient Prescriptions on File Prior to Visit  Medication Sig Dispense Refill  . aspirin 81 MG tablet Take 81 mg by mouth daily.    . carvedilol (COREG) 6.25 MG tablet Take 6.25 mg by mouth 2 (two) times daily with a meal.    . celecoxib (CELEBREX) 200 MG capsule Take 200 mg by mouth daily.    . fluorouracil (EFUDEX) 5 % cream Apply topically 2 (two) times daily. 40 g 0  . hydroxychloroquine (PLAQUENIL) 200 MG tablet Take 200 mg by mouth 2 (two) times daily.     Marland Kitchen L-Methylfolate-B6-B12 (METANX PO) Take by mouth daily.    Marland Kitchen losartan-hydrochlorothiazide (HYZAAR) 50-12.5 MG tablet Take 1 tablet by mouth daily.    Marland Kitchen NOVOLOG 100 UNIT/ML injection Insulin pump    . simvastatin (ZOCOR) 40 MG tablet Take 40 mg by mouth daily.     No current facility-administered medications on file prior to visit.     No Known Allergies  Objective:  General: Alert and oriented x3 in no acute distress  Dermatology: Keratotic lesions present measuring <0.5cm at left plantar 1st toe, plantar ball and heel that is coalescing together to make 1 large lateral heel wart and Right possible plantar medial heel with no skin lines  transversing the lesions, minimal pain is present with medial lateral pressure to the lesions, capillaries with pin point bleeding noted, no webspace macerations, no ecchymosis bilateral, all nails x 10 are short and well manicured.   Asymptomatic very small plantar fibroma in medial arch on left and history of leg lipoma that is nonpainful.   Vascular: Dorsalis Pedis and Posterior Tibial pedal pulses 1/4, Capillary Fill Time 3 seconds, + scant pedal hair growth bilateral, mild varicosities, no edema bilateral lower extremities, Temperature gradient within normal limits.  Neurology: Gross sensation intact via light touch bilateral. Protective intact. Vibratory slightly diminished bilateral.   Musculoskeletal: Minimal tenderness with palpation at the plantar wart sites, Muscular strength 5/5 in all groups without pain or limitation on range of motion.   Assessment and Plan: Problem List Items Addressed This Visit      Musculoskeletal and Integument   Plantar wart - Primary    Other Visit Diagnoses    Diabetic polyneuropathy associated with type 1 diabetes mellitus ([place])          -Complete examination performed -Discussed treatment options for warts -Patient opt for surgical management. Consent obtained for laser destruction of warts L>R. Pre and Post op course explained. Risks, benefits, alternatives explained. No guarantees given or implied. Surgical booking slip submitted and provided patient with Surgical packet and info for [facility]. Advised patient that she will need 1 day of rest after surgery;  may have to accommodate patient and do procedure on a Friday so she can have time to rest over the week before returning to her job on Monday. Patient to call [facility] for surgical date.  -To dispense post op shoes at surgical center and advised patient that she will have to have someone to drive her to the surgical center and to 1st post op visit until shoes and dressings are discontinued.  -Patient  to return to office after laser surgery for warts or sooner if condition worsens.  Landis Martins, DPM

## 2017-01-30 ENCOUNTER — Telehealth: Payer: Self-pay | Admitting: *Deleted

## 2017-01-30 NOTE — Telephone Encounter (Signed)
"  I want to call with a couple of questions and see about scheduling my procedure."

## 2017-01-31 NOTE — Telephone Encounter (Signed)
"  Calling again to schedule my procedure."

## 2017-02-01 NOTE — Telephone Encounter (Signed)
"  I'm calling to schedule my surgery."  I know you want to do it on a Friday but Fridays are not available.  Dr. Cannon Kettle does not have any block time on Fridays, the time belongs to other doctors.  She can do it on a Monday.  "I was hoping it could be done on a Friday.  Since I can't, can she do it on November 12 or 19?"  Both dates are available, which would you prefer?  "Let's schedule it for November 19 since that will be a holiday."  I'll get it scheduled.  Someone from the surgical center will call with the arrival time the Friday before."

## 2017-02-28 DIAGNOSIS — M351 Other overlap syndromes: Secondary | ICD-10-CM | POA: Insufficient documentation

## 2017-03-11 ENCOUNTER — Encounter: Payer: Self-pay | Admitting: Sports Medicine

## 2017-03-11 DIAGNOSIS — D492 Neoplasm of unspecified behavior of bone, soft tissue, and skin: Secondary | ICD-10-CM | POA: Diagnosis not present

## 2017-03-12 ENCOUNTER — Telehealth: Payer: Self-pay | Admitting: Sports Medicine

## 2017-03-12 DIAGNOSIS — H43812 Vitreous degeneration, left eye: Secondary | ICD-10-CM | POA: Insufficient documentation

## 2017-03-12 NOTE — Telephone Encounter (Signed)
Post op check patient states that she is doing well. Took Motrin last night but no other symptoms. Patient to return as scheduled for continued post op care. -Dr. Cannon Kettle

## 2017-03-14 ENCOUNTER — Ambulatory Visit: Payer: BC Managed Care – PPO | Admitting: Obstetrics and Gynecology

## 2017-03-18 ENCOUNTER — Other Ambulatory Visit: Payer: Self-pay

## 2017-03-18 ENCOUNTER — Emergency Department (HOSPITAL_BASED_OUTPATIENT_CLINIC_OR_DEPARTMENT_OTHER)
Admission: EM | Admit: 2017-03-18 | Discharge: 2017-03-18 | Disposition: A | Discharge status: Home / Self Care | Payer: Worker's Compensation | Attending: Emergency Medicine | Admitting: Emergency Medicine

## 2017-03-18 ENCOUNTER — Encounter (HOSPITAL_BASED_OUTPATIENT_CLINIC_OR_DEPARTMENT_OTHER): Payer: Self-pay | Admitting: *Deleted

## 2017-03-18 DIAGNOSIS — Z7982 Long term (current) use of aspirin: Secondary | ICD-10-CM | POA: Insufficient documentation

## 2017-03-18 DIAGNOSIS — E109 Type 1 diabetes mellitus without complications: Secondary | ICD-10-CM | POA: Diagnosis not present

## 2017-03-18 DIAGNOSIS — Y939 Activity, unspecified: Secondary | ICD-10-CM | POA: Diagnosis not present

## 2017-03-18 DIAGNOSIS — Y990 Civilian activity done for income or pay: Secondary | ICD-10-CM | POA: Insufficient documentation

## 2017-03-18 DIAGNOSIS — Z794 Long term (current) use of insulin: Secondary | ICD-10-CM | POA: Diagnosis not present

## 2017-03-18 DIAGNOSIS — I10 Essential (primary) hypertension: Secondary | ICD-10-CM | POA: Diagnosis not present

## 2017-03-18 DIAGNOSIS — Y929 Unspecified place or not applicable: Secondary | ICD-10-CM | POA: Insufficient documentation

## 2017-03-18 DIAGNOSIS — H43812 Vitreous degeneration, left eye: Secondary | ICD-10-CM | POA: Diagnosis not present

## 2017-03-18 DIAGNOSIS — S0592XA Unspecified injury of left eye and orbit, initial encounter: Secondary | ICD-10-CM | POA: Diagnosis present

## 2017-03-18 DIAGNOSIS — S0592XD Unspecified injury of left eye and orbit, subsequent encounter: Secondary | ICD-10-CM

## 2017-03-18 NOTE — Discharge Instructions (Signed)
Follow up with [facility] for further treatment and referrals

## 2017-03-18 NOTE — ED Triage Notes (Signed)
Pt c/o eye injury 11/7 , seen by EYE MD  On 11/13 DX posterior vitreous detachment, here today for  [facility] paperwork
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
## 2017-03-18 NOTE — ED Notes (Signed)
No obvious signs of irritation or trauam noted to eye. No drainage present.

## 2017-03-19 ENCOUNTER — Ambulatory Visit (INDEPENDENT_AMBULATORY_CARE_PROVIDER_SITE_OTHER): Payer: BC Managed Care – PPO | Admitting: Sports Medicine

## 2017-03-19 ENCOUNTER — Encounter: Payer: Self-pay | Admitting: Sports Medicine

## 2017-03-19 DIAGNOSIS — E1042 Type 1 diabetes mellitus with diabetic polyneuropathy: Secondary | ICD-10-CM

## 2017-03-19 DIAGNOSIS — B070 Plantar wart: Secondary | ICD-10-CM

## 2017-03-19 NOTE — Progress Notes (Signed)
Subjective: Julia Nash is a 61 y.o. female patient seen today in office for POV #1 (DOS 03-11-17), S/P Laser destruction of warts bilateral. Patient denies pain at surgical site, denies calf pain, denies headache, chest pain, shortness of breath, nausea, vomiting, fever, or chills. Patient states that she is doing well and is only taking Ibuprofen as needed only took night after surgery. No other issues noted.   Patient Active Problem List   Diagnosis Date Noted  . Plantar wart 09/25/2016  . Diabetes mellitus type 1 ([place])   . Hypertension     Current Outpatient Medications on File Prior to Visit  Medication Sig Dispense Refill  . aspirin 81 MG tablet Take 81 mg by mouth daily.    . carvedilol (COREG) 6.25 MG tablet Take 6.25 mg by mouth 2 (two) times daily with a meal.    . celecoxib (CELEBREX) 200 MG capsule Take 200 mg by mouth daily.    . fluorouracil (EFUDEX) 5 % cream Apply topically 2 (two) times daily. 40 g 0  . hydroxychloroquine (PLAQUENIL) 200 MG tablet Take 200 mg by mouth 2 (two) times daily.     Marland Kitchen losartan-hydrochlorothiazide (HYZAAR) 50-12.5 MG tablet Take 1 tablet by mouth daily.    Marland Kitchen NOVOLOG 100 UNIT/ML injection Insulin pump    . simvastatin (ZOCOR) 40 MG tablet Take 40 mg by mouth daily.     No current facility-administered medications on file prior to visit.     No Known Allergies  Objective: There were no vitals filed for this visit.  General: No acute distress, AAOx3  Right/Left foot: Wart sites are scabbed over, no swelling, no erythema, no warmth, no drainage, no signs of infection noted, Capillary fill time <3 seconds in all digits, gross sensation present via light touch to right or left foot. No pain or crepitation with range of motion right or left foot.  No pain with calf compression.    Assessment and Plan:  Problem List Items Addressed This Visit      Musculoskeletal and Integument   Plantar wart - Primary    Other Visit Diagnoses    Diabetic  polyneuropathy associated with type 1 diabetes mellitus ([place])           -Patient seen and evaluated -Applied salinocaine and bandaids to areas and advised patient to use topical wart cream that she has at home x1 week and pumice stone to help exfoliate the areas -Advised patient may return to normal shoes and activities   -Advised patient that at next visit may add on tagamet  -Will plan for destructed wart site check at next office visit. In the meantime, patient to call office if any issues or problems arise.   Landis Martins, DPM

## 2017-03-26 ENCOUNTER — Encounter: Payer: Self-pay | Admitting: Sports Medicine

## 2017-03-26 ENCOUNTER — Ambulatory Visit (INDEPENDENT_AMBULATORY_CARE_PROVIDER_SITE_OTHER): Payer: BC Managed Care – PPO | Admitting: Sports Medicine

## 2017-03-26 DIAGNOSIS — B070 Plantar wart: Secondary | ICD-10-CM

## 2017-03-26 MED ORDER — CIMETIDINE 400 MG PO TABS: 400.0000 mg | ORAL_TABLET | Freq: Two times a day (BID) | ORAL | 0 refills | Status: DC

## 2017-03-26 MED ORDER — FLUOROURACIL 5 % EX CREA: TOPICAL_CREAM | Freq: Two times a day (BID) | CUTANEOUS | 0 refills | Status: DC

## 2017-03-26 NOTE — Progress Notes (Signed)
Subjective: Julia Nash is a 61 y.o. female patient seen today in office for POV #2 (DOS 03-11-17), S/P Laser destruction of warts bilateral. Patient denies pain at surgical site, states that things are about the same, new throbbing last night at left foot that's not hurting today. At warts has been using cream. No other issues noted.   Patient Active Problem List   Diagnosis Date Noted  . Plantar wart 09/25/2016  . Diabetes mellitus type 1 ([place])   . Hypertension     Current Outpatient Medications on File Prior to Visit  Medication Sig Dispense Refill  . aspirin 81 MG tablet Take 81 mg by mouth daily.    . carvedilol (COREG) 6.25 MG tablet Take 6.25 mg by mouth 2 (two) times daily with a meal.    . celecoxib (CELEBREX) 200 MG capsule Take 200 mg by mouth daily.    . hydroxychloroquine (PLAQUENIL) 200 MG tablet Take 200 mg by mouth 2 (two) times daily.     Marland Kitchen losartan-hydrochlorothiazide (HYZAAR) 50-12.5 MG tablet Take 1 tablet by mouth daily.    Marland Kitchen NOVOLOG 100 UNIT/ML injection Insulin pump    . simvastatin (ZOCOR) 40 MG tablet Take 40 mg by mouth daily.     No current facility-administered medications on file prior to visit.     No Known Allergies  Objective: There were no vitals filed for this visit.  General: No acute distress, AAOx3  Right/Left foot: Wart sites are continuing to scab over, no swelling, no erythema, no warmth, no drainage, no signs of infection noted, Capillary fill time <3 seconds in all digits, gross sensation present via light touch to right or left foot. No pain or crepitation with range of motion right or left foot.  No pain with calf compression.    Assessment and Plan:  Problem List Items Addressed This Visit      Musculoskeletal and Integument   Plantar wart - Primary   Relevant Medications   fluorouracil (EFUDEX) 5 % cream   cimetidine (TAGAMET) 400 MG tablet       -Patient seen and evaluated -Applied salinocaine and bandaids to areas and  advised patient to use topical wart cream that she has at home x1 week and pumice stone to help exfoliate the areas; Refill provided -Rx Tagamet  -Advised patient continue with good supportive shoes  -Advised patient that if occasional pain in left foot continues will xray -Will plan for destructed wart site check at next office visit. In the meantime, patient to call office if any issues or problems arise.   Landis Martins, DPM

## 2017-04-02 ENCOUNTER — Telehealth: Payer: Self-pay | Admitting: Sports Medicine

## 2017-04-02 NOTE — Telephone Encounter (Signed)
I have a question about the medication Dr. Cannon Kettle prescribed cimetidine. I had trouble getting it and I finally got a partial supply. I went back to see if I could get the rest and they said it is on back order until the end of the month. I was wanting to know if Dr. Cannon Kettle recommends anything else I should take since I'm having such a tough time getting this medication or any other recommendations. I use the [facility] on [address] in [place]. If you could please call me back at [phone]. Thank you.

## 2017-04-02 NOTE — Telephone Encounter (Signed)
Informed patient of orders.

## 2017-04-02 NOTE — Telephone Encounter (Signed)
She can get Tagamet over the counter. I think its 200mg  she should take 2 tablets twice daily to equal the Rx I prescribed for her -Dr. Chauncey Cruel

## 2017-04-09 ENCOUNTER — Encounter: Payer: BC Managed Care – PPO | Admitting: Sports Medicine

## 2017-04-18 ENCOUNTER — Other Ambulatory Visit: Payer: Self-pay

## 2017-04-18 ENCOUNTER — Ambulatory Visit: Payer: BC Managed Care – PPO | Admitting: Obstetrics and Gynecology

## 2017-04-18 ENCOUNTER — Encounter: Payer: Self-pay | Admitting: Obstetrics and Gynecology

## 2017-04-18 VITALS — BP 150/80 | HR 80 | Resp 16 | Ht 64.75 in | Wt 183.0 lb

## 2017-04-18 DIAGNOSIS — Z124 Encounter for screening for malignant neoplasm of cervix: Secondary | ICD-10-CM | POA: Diagnosis not present

## 2017-04-18 DIAGNOSIS — Z01419 Encounter for gynecological examination (general) (routine) without abnormal findings: Secondary | ICD-10-CM

## 2017-04-18 NOTE — Progress Notes (Signed)
61 y.o. G0P0000 MarriedCaucasianF here for annual exam.  No vaginal bleeding, sexually active, no pain.   She has DM, she is using an insulin pump. Her hgbA1C is under a little better control. Husband is in the hospital for a resistant UT (quadraplegic). She is under a lot of stress, worried about her husband, too much going on at work. Tearful.  She is hoping to retire this May.   Patient's last menstrual period was 03/31/2011.          Sexually active: Yes.    The current method of family planning is post menopausal status.    Exercising: No.  The patient does not participate in regular exercise at present. Smoker:  no  Health Maintenance: Pap:  12-16-13 WNL NEG HR HPV 10-29-12 WNL NEG HR HPV  History of abnormal Pap:  no MMG:  06-22-16 WNL  Colonoscopy:  03-10-12 polyps, GI told her not due for f/u yet.  BMD:   Never TDaP:  Unsure, thinks UTD Gardasil: N/A   reports that  has never smoked. she has never used smokeless tobacco. She reports that she drinks about 4.2 oz of alcohol per week. She reports that she does not use drugs. Husband is a quadriplegic (since 12), they have been together for 36 years ago. 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   Past Medical History:  Diagnosis Date  . Diabetes mellitus type 1 ([place])    age 65  . Fibroid   . Hypertension     Past Surgical History:  Procedure Laterality Date  . DILATION AND CURETTAGE OF UTERUS    . FOOT SURGERY Left age 79  . TONSILLECTOMY AND ADENOIDECTOMY      Current Outpatient Medications  Medication Sig Dispense Refill  . aspirin 81 MG tablet Take 81 mg by mouth daily.    . carvedilol (COREG) 6.25 MG tablet Take 6.25 mg by mouth 2 (two) times daily with a meal.    . celecoxib (CELEBREX) 200 MG capsule Take 200 mg by mouth daily.    . cimetidine (TAGAMET) 400 MG tablet Take 1 tablet (400 mg total) by mouth 2 (two) times daily. 28 tablet 0  . fluorouracil (EFUDEX) 5 % cream Apply topically 2 (two) times  daily. 40 g 0  . hydroxychloroquine (PLAQUENIL) 200 MG tablet Take 200 mg by mouth 2 (two) times daily.     Marland Kitchen losartan-hydrochlorothiazide (HYZAAR) 50-12.5 MG tablet Take 1 tablet by mouth daily.    Marland Kitchen NOVOLOG 100 UNIT/ML injection Insulin pump    . simvastatin (ZOCOR) 40 MG tablet Take 40 mg by mouth daily.     No current facility-administered medications for this visit.   On plaquenil for joint pain and swollen tendons    Family History  Problem Relation Age of Onset  . Breast cancer Mother 51  . Breast cancer Maternal Aunt 70    Review of Systems  Constitutional: Negative.   HENT: Negative.   Eyes: Negative.   Respiratory: Negative.   Cardiovascular: Negative.   Gastrointestinal: Negative.   Endocrine: Negative.   Genitourinary: Negative.   Musculoskeletal: Negative.   Skin: Negative.   Allergic/Immunologic: Negative.   Neurological: Negative.   Psychiatric/Behavioral: Negative.     Exam:   BP (!) 150/80 (BP Location: Right Arm, Patient Position: Sitting, Cuff Size: Normal)   Pulse 80   Resp 16   Ht 5' 4.75" (1.645 m)   Wt 183 lb (83 kg)   LMP 03/31/2011   BMI  30.69 kg/m   Weight change: @WEIGHTCHANGE @ Height:   Height: 5' 4.75" (164.5 cm)  Ht Readings from Last 3 Encounters:  04/18/17 5' 4.75" (1.645 m)  03/18/17 5\' 6"  (1.676 m)  03/12/16 5' 5.5" (1.664 m)    General appearance: alert, cooperative and appears stated age Head: Normocephalic, without obvious abnormality, atraumatic Neck: no adenopathy, supple, symmetrical, trachea midline and thyroid normal to inspection and palpation Lungs: clear to auscultation bilaterally Cardiovascular: regular rate and rhythm Breasts: normal appearance, no masses or tenderness Abdomen: soft, non-tender; non distended,  no masses,  no organomegaly Extremities: extremities normal, atraumatic, no cyanosis or edema Skin: Skin color, texture, turgor normal. No rashes or lesions Lymph nodes: Cervical, supraclavicular, and  axillary nodes normal. No abnormal inguinal nodes palpated Neurologic: Grossly normal   Pelvic: External genitalia:  no lesions              Urethra:  normal appearing urethra with no masses, tenderness or lesions              Bartholins and Skenes: normal                 Vagina: normal appearing vagina with normal color and discharge, no lesions              Cervix: no lesions               Bimanual Exam:  Uterus:  normal size, contour, position, consistency, mobility, non-tender              Adnexa: no mass, fullness, tenderness               Rectovaginal: Confirms               Anus:  normal sphincter tone, no lesions  Chaperone was present for exam.  A:  Well Woman with normal exam  DM, working on improving control  P:   Pap with hpv  Mammogram in 2/19  Colonoscopy, she will check with her primary  Discussed breast self exam  Discussed calcium and vit D intake

## 2017-04-18 NOTE — Patient Instructions (Signed)

## 2017-04-25 ENCOUNTER — Other Ambulatory Visit (HOSPITAL_COMMUNITY)
Admission: RE | Admit: 2017-04-25 | Discharge: 2017-04-25 | Disposition: A | Discharge status: Home / Self Care | Payer: BC Managed Care – PPO | Source: Ambulatory Visit | Attending: Obstetrics and Gynecology | Admitting: Obstetrics and Gynecology

## 2017-04-25 DIAGNOSIS — Z124 Encounter for screening for malignant neoplasm of cervix: Secondary | ICD-10-CM | POA: Insufficient documentation

## 2017-04-25 NOTE — Addendum Note (Signed)
Addended by: Dorothy Spark on: 04/25/2017 04:54 PM   Modules accepted: Orders

## 2017-04-29 LAB — CYTOLOGY - PAP
DIAGNOSIS: NEGATIVE
HPV (WINDOPATH): NOT DETECTED

## 2017-05-06 ENCOUNTER — Encounter: Payer: Self-pay | Admitting: Internal Medicine

## 2017-05-14 ENCOUNTER — Ambulatory Visit (INDEPENDENT_AMBULATORY_CARE_PROVIDER_SITE_OTHER): Payer: BC Managed Care – PPO | Admitting: Sports Medicine

## 2017-05-14 ENCOUNTER — Encounter: Payer: Self-pay | Admitting: Sports Medicine

## 2017-05-14 ENCOUNTER — Telehealth: Payer: Self-pay | Admitting: *Deleted

## 2017-05-14 DIAGNOSIS — B070 Plantar wart: Secondary | ICD-10-CM

## 2017-05-14 DIAGNOSIS — E1042 Type 1 diabetes mellitus with diabetic polyneuropathy: Secondary | ICD-10-CM

## 2017-05-14 NOTE — Telephone Encounter (Signed)
-----   Message from Landis Martins, [place] sent at 05/14/2017  8:57 AM EST ----- Regarding: Derm consult Warts on left foot no better after CO2 laser treatments Wart hx for over 10 years

## 2017-05-14 NOTE — Progress Notes (Signed)
  Subjective: Julia Nash is a 62 y.o. female patient seen today in office for POV #3 (DOS 03-11-17), S/P Laser destruction of warts bilateral. Patient denies pain at surgical site, states that things are about the same on left some lesions look larger but has been compliant with Tagamet and has been using cream. No other issues noted.   Patient Active Problem List   Diagnosis Date Noted  . Plantar wart 09/25/2016  . Diabetes mellitus type 1 ([place])   . Hypertension     Current Outpatient Medications on File Prior to Visit  Medication Sig Dispense Refill  . aspirin 81 MG tablet Take 81 mg by mouth daily.    . carvedilol (COREG) 6.25 MG tablet Take 6.25 mg by mouth 2 (two) times daily with a meal.    . celecoxib (CELEBREX) 200 MG capsule Take 200 mg by mouth daily.    . cimetidine (TAGAMET) 400 MG tablet Take 1 tablet (400 mg total) by mouth 2 (two) times daily. 28 tablet 0  . fluorouracil (EFUDEX) 5 % cream Apply topically 2 (two) times daily. 40 g 0  . hydroxychloroquine (PLAQUENIL) 200 MG tablet Take 200 mg by mouth 2 (two) times daily.     Marland Kitchen losartan-hydrochlorothiazide (HYZAAR) 50-12.5 MG tablet Take 1 tablet by mouth daily.    Marland Kitchen NOVOLOG 100 UNIT/ML injection Insulin pump    . simvastatin (ZOCOR) 40 MG tablet Take 40 mg by mouth daily.     No current facility-administered medications on file prior to visit.     No Known Allergies  Objective: There were no vitals filed for this visit.  General: No acute distress, AAOx3  Right/Left foot: Wart sites are continuing to scab over, there is evidence of residual warts on left heel and 1st toe even after laser, no swelling, no erythema, no warmth, no drainage, no signs of infection noted, Capillary fill time <3 seconds in all digits, gross sensation present via light touch to right or left foot. No pain or crepitation with range of motion right or left foot.  No pain with calf compression.    Assessment and Plan:  Problem List Items  Addressed This Visit      Musculoskeletal and Integument   Plantar wart - Primary    Other Visit Diagnoses    Diabetic polyneuropathy associated with type 1 diabetes mellitus ([place])           -Patient seen and evaluated -Consult to dermatology was made to discuss other treatments for warts since Co2 laser did not get rid of them -Continue Tagamet and topical wart cream  -Advised patient continue with good supportive shoes and good hygiene habits  -Return after dermatology or PRN. In the meantime, patient to call office if any issues or problems arise.   Landis Martins, DPM

## 2017-05-14 NOTE — Telephone Encounter (Signed)
Faxed referral, clinicals and demographics to [facility].

## 2017-05-31 NOTE — Progress Notes (Signed)
DOS 03/11/17 Laser destruction of wart Lt and Rt

## 2017-06-21 ENCOUNTER — Other Ambulatory Visit: Payer: Self-pay | Admitting: Physician Assistant

## 2017-06-21 DIAGNOSIS — C439 Malignant melanoma of skin, unspecified: Secondary | ICD-10-CM

## 2017-06-21 HISTORY — DX: Malignant melanoma of skin, unspecified: C43.9

## 2017-06-24 ENCOUNTER — Other Ambulatory Visit: Payer: Self-pay | Admitting: Obstetrics and Gynecology

## 2017-06-24 DIAGNOSIS — Z1231 Encounter for screening mammogram for malignant neoplasm of breast: Secondary | ICD-10-CM

## 2017-06-26 ENCOUNTER — Ambulatory Visit (HOSPITAL_BASED_OUTPATIENT_CLINIC_OR_DEPARTMENT_OTHER)
Admission: RE | Admit: 2017-06-26 | Discharge: 2017-06-26 | Disposition: A | Discharge status: Home / Self Care | Payer: BC Managed Care – PPO | Source: Ambulatory Visit | Attending: Obstetrics and Gynecology | Admitting: Obstetrics and Gynecology

## 2017-06-26 DIAGNOSIS — R928 Other abnormal and inconclusive findings on diagnostic imaging of breast: Secondary | ICD-10-CM | POA: Insufficient documentation

## 2017-06-26 DIAGNOSIS — Z1231 Encounter for screening mammogram for malignant neoplasm of breast: Secondary | ICD-10-CM | POA: Insufficient documentation

## 2017-06-26 DIAGNOSIS — N6310 Unspecified lump in the right breast, unspecified quadrant: Secondary | ICD-10-CM | POA: Diagnosis not present

## 2017-06-27 ENCOUNTER — Ambulatory Visit (HOSPITAL_BASED_OUTPATIENT_CLINIC_OR_DEPARTMENT_OTHER): Payer: Self-pay

## 2017-06-28 ENCOUNTER — Ambulatory Visit (AMBULATORY_SURGERY_CENTER): Payer: Self-pay

## 2017-06-28 ENCOUNTER — Telehealth: Payer: Self-pay

## 2017-06-28 ENCOUNTER — Other Ambulatory Visit: Payer: Self-pay | Admitting: Obstetrics and Gynecology

## 2017-06-28 ENCOUNTER — Telehealth: Payer: Self-pay | Admitting: Obstetrics and Gynecology

## 2017-06-28 ENCOUNTER — Other Ambulatory Visit: Payer: Self-pay

## 2017-06-28 VITALS — Ht 66.0 in | Wt 186.0 lb

## 2017-06-28 DIAGNOSIS — Z86010 Personal history of colonic polyps: Secondary | ICD-10-CM

## 2017-06-28 DIAGNOSIS — R928 Other abnormal and inconclusive findings on diagnostic imaging of breast: Secondary | ICD-10-CM

## 2017-06-28 NOTE — Telephone Encounter (Signed)
Patient has questions regarding her mammogram results.

## 2017-06-28 NOTE — Telephone Encounter (Signed)
Julia Nash, Julia Nash MRN [id number] is scheduled for a colonoscopy with Dr. Carlean Purl on Friday 07/12/17 @ 8:00 Am. This patient has an insulin pump using Novalog. The physician that manages her pump  is Dr. Domenick Gong. Oren Section is the diabetic coordinator for Dr. Osborne Casco and she may be able to advise on her insulin. I told the patient that someone would be calling her to advise her prior to her procedure. Thanks!   Riki Sheer, LPN ( [place] )

## 2017-06-28 NOTE — Telephone Encounter (Signed)
I have faxed the insulin pump letter to Dr Loren Racer office to ATT: Oren Section.

## 2017-06-28 NOTE — Progress Notes (Signed)
Denies allergies to eggs or soy products. Denies complication of anesthesia or sedation. Denies use of weight loss medication. Denies use of O2.   Emmi instructions declined.  

## 2017-06-28 NOTE — Telephone Encounter (Signed)
Spoke with patient. Patient states she was called and advised additional testing was needed from screening MMG, no additional information was provided, patient is concerned.   Reviewed 06/26/17 screening MMG, advised additional imaging recommended for area of concern in right breast. Patient is scheduled for dx imaging on 07/01/17. Advised patient radiologist will review results and advise on f/u with her day of imaging. Dr. Talbert Nan will also be forwarded results. Patient verbalizes understanding and is agreeable.   Routing to provider for final review. Patient is agreeable to disposition. Will close encounter.

## 2017-07-01 ENCOUNTER — Ambulatory Visit
Admission: RE | Admit: 2017-07-01 | Discharge: 2017-07-01 | Disposition: A | Discharge status: Home / Self Care | Payer: BC Managed Care – PPO | Source: Ambulatory Visit | Attending: Obstetrics and Gynecology | Admitting: Obstetrics and Gynecology

## 2017-07-01 ENCOUNTER — Other Ambulatory Visit: Payer: Self-pay | Admitting: Obstetrics and Gynecology

## 2017-07-01 DIAGNOSIS — N6310 Unspecified lump in the right breast, unspecified quadrant: Secondary | ICD-10-CM

## 2017-07-01 DIAGNOSIS — R928 Other abnormal and inconclusive findings on diagnostic imaging of breast: Secondary | ICD-10-CM

## 2017-07-01 NOTE — Telephone Encounter (Signed)
Left her a message to call me back. 

## 2017-07-02 ENCOUNTER — Telehealth: Payer: Self-pay | Admitting: *Deleted

## 2017-07-02 NOTE — Telephone Encounter (Signed)
Request for refill Efudex. Dr. Cannon Kettle states if pt is continuing to have problems, she needs to be seen in office. Return fax denying.

## 2017-07-03 ENCOUNTER — Ambulatory Visit
Admission: RE | Admit: 2017-07-03 | Discharge: 2017-07-03 | Disposition: A | Discharge status: Home / Self Care | Payer: BC Managed Care – PPO | Source: Ambulatory Visit | Attending: Obstetrics and Gynecology | Admitting: Obstetrics and Gynecology

## 2017-07-03 ENCOUNTER — Other Ambulatory Visit: Payer: Self-pay | Admitting: Obstetrics and Gynecology

## 2017-07-03 DIAGNOSIS — N6310 Unspecified lump in the right breast, unspecified quadrant: Secondary | ICD-10-CM

## 2017-07-04 ENCOUNTER — Encounter: Payer: Self-pay | Admitting: Internal Medicine

## 2017-07-04 NOTE — Telephone Encounter (Signed)
Patient informed to" reduce basal temp to 60%, check sugars hourly day of procedure, correction boluses  If greater than 200, resume prior basal with first post procedure meal " per Dr Osborne Casco. This was hand written and faxed to [place]. I will send to be scanned in. Patient verbalized understanding.

## 2017-07-12 ENCOUNTER — Ambulatory Visit (AMBULATORY_SURGERY_CENTER): Payer: BC Managed Care – PPO | Admitting: Internal Medicine

## 2017-07-12 ENCOUNTER — Other Ambulatory Visit: Payer: Self-pay

## 2017-07-12 ENCOUNTER — Encounter: Payer: Self-pay | Admitting: Internal Medicine

## 2017-07-12 VITALS — BP 159/58 | HR 67 | Temp 95.3°F | Resp 21 | Ht 66.0 in | Wt 186.0 lb

## 2017-07-12 DIAGNOSIS — Z86010 Personal history of colonic polyps: Secondary | ICD-10-CM

## 2017-07-12 DIAGNOSIS — D123 Benign neoplasm of transverse colon: Secondary | ICD-10-CM

## 2017-07-12 DIAGNOSIS — D124 Benign neoplasm of descending colon: Secondary | ICD-10-CM | POA: Diagnosis not present

## 2017-07-12 DIAGNOSIS — K635 Polyp of colon: Secondary | ICD-10-CM | POA: Diagnosis not present

## 2017-07-12 MED ORDER — SODIUM CHLORIDE 0.9 % IV SOLN
500.0000 mL | Freq: Once | INTRAVENOUS | Status: DC
Start: 2017-07-12 — End: 2020-11-01

## 2017-07-12 NOTE — Progress Notes (Signed)
To recovery, report to RN, VSS. 

## 2017-07-12 NOTE — Op Note (Signed)
[place] Patient Name: Julia Nash Procedure Date: 07/12/2017 7:49 AM MRN: [id number] Endoscopist: Gatha Mayer , MD Age: 62 Referring MD:  Date of Birth: Sep 26, 1955 Gender: Female Account #: [id number] Procedure:                Colonoscopy Indications:              Surveillance: Personal history of adenomatous                            polyps on last colonoscopy > 5 years ago Medicines:                Monitored Anesthesia Care Procedure:                Pre-Anesthesia Assessment:                           - Prior to the procedure, a History and Physical                            was performed, and patient medications and                            allergies were reviewed. The patient's tolerance of                            previous anesthesia was also reviewed. The risks                            and benefits of the procedure and the sedation                            options and risks were discussed with the patient.                            All questions were answered, and informed consent                            was obtained. Prior Anticoagulants: The patient has                            taken no previous anticoagulant or antiplatelet                            agents. ASA Grade Assessment: II - A patient with                            mild systemic disease. After reviewing the risks                            and benefits, the patient was deemed in                            satisfactory condition to undergo the procedure.  After obtaining informed consent, the colonoscope                            was passed under direct vision. Throughout the                            procedure, the patient's blood pressure, pulse, and                            oxygen saturations were monitored continuously. The                            Colonoscope was introduced through the anus and                            advanced to the the cecum,  identified by                            appendiceal orifice and ileocecal valve. The                            colonoscopy was performed without difficulty. The                            patient tolerated the procedure well. The quality                            of the bowel preparation was good. The bowel                            preparation used was Miralax. The ileocecal valve,                            appendiceal orifice, and rectum were photographed. Scope In: 8:09:28 AM Scope Out: 8:27:39 AM Scope Withdrawal Time: 0 hours 15 minutes 4 seconds  Total Procedure Duration: 0 hours 18 minutes 11 seconds  Findings:                 The perianal and digital rectal examinations were                            normal.                           Two sessile polyps were found in the descending                            colon and transverse colon. The polyps were 4 to 10                            mm in size. These polyps were removed with a cold                            snare. Resection and retrieval were complete.  Verification of patient identification for the                            specimen was done. Estimated blood loss was minimal.                           The exam was otherwise without abnormality on                            direct and retroflexion views. Complications:            No immediate complications. Estimated Blood Loss:     Estimated blood loss was minimal. Impression:               - Two 4 to 10 mm polyps in the descending colon and                            in the transverse colon, removed with a cold snare.                            Resected and retrieved.                           - The examination was otherwise normal on direct                            and retroflexion views. Recommendation:           - Patient has a contact number available for                            emergencies. The signs and symptoms of potential                             delayed complications were discussed with the                            patient. Return to normal activities tomorrow.                            Written discharge instructions were provided to the                            patient.                           - Resume previous diet.                           - Continue present medications.                           - Repeat colonoscopy is recommended for                            surveillance. The colonoscopy date will be  determined after pathology results from today's                            exam become available for review. Gatha Mayer, MD 07/12/2017 8:39:01 AM This report has been signed electronically.

## 2017-07-12 NOTE — Progress Notes (Signed)
Called to room to assist during endoscopic procedure.  Patient ID and intended procedure confirmed with present staff. Received instructions for my participation in the procedure from the performing physician.  

## 2017-07-12 NOTE — Progress Notes (Signed)
Pt has insulin pump and sensor.  Her blood sugar is 262.  Her insulin pump in running as a basal rate of 1.35 units an hour of Humalog.  She has 5 units on insulin on board.  Her pump posts her blood sugars continuously.  Will make Dr. Carlean Purl and CRNA aware of sugar and pump

## 2017-07-12 NOTE — Patient Instructions (Addendum)
   I found and removed 2 polyps today.  Both look benign.  I will let you know pathology results and when to have another routine colonoscopy by mail and/or My Chart.  I appreciate the opportunity to care for you. Gatha Mayer, MD, FACG YOU HAD AN ENDOSCOPIC PROCEDURE TODAY AT [facility]:   Refer to the procedure report that was given to you for any specific questions about what was found during the examination.  If the procedure report does not answer your questions, please call your gastroenterologist to clarify.  If you requested that your care partner not be given the details of your procedure findings, then the procedure report has been included in a sealed envelope for you to review at your convenience later.  YOU SHOULD EXPECT: Some feelings of bloating in the abdomen. Passage of more gas than usual.  Walking can help get rid of the air that was put into your GI tract during the procedure and reduce the bloating. If you had a lower endoscopy (such as a colonoscopy or flexible sigmoidoscopy) you may notice spotting of blood in your stool or on the toilet paper. If you underwent a bowel prep for your procedure, you may not have a normal bowel movement for a few days.  Please Note:  You might notice some irritation and congestion in your nose or some drainage.  This is from the oxygen used during your procedure.  There is no need for concern and it should clear up in a day or so.  SYMPTOMS TO REPORT IMMEDIATELY:   Following lower endoscopy (colonoscopy or flexible sigmoidoscopy):  Excessive amounts of blood in the stool  Significant tenderness or worsening of abdominal pains  Swelling of the abdomen that is new, acute  Fever of 100F or higher   For urgent or emergent issues, a gastroenterologist can be reached at any hour by calling [phone].   DIET:  We do recommend a small meal at first, but then you may proceed to your regular diet.  Drink plenty of  fluids but you should avoid alcoholic beverages for 24 hours.  ACTIVITY:  You should plan to take it easy for the rest of today and you should NOT DRIVE or use heavy machinery until tomorrow (because of the sedation medicines used during the test).    FOLLOW UP: Our staff will call the number listed on your records the next business day following your procedure to check on you and address any questions or concerns that you may have regarding the information given to you following your procedure. If we do not reach you, we will leave a message.  However, if you are feeling well and you are not experiencing any problems, there is no need to return our call.  We will assume that you have returned to your regular daily activities without incident.  If any biopsies were taken you will be contacted by phone or by letter within the next 1-3 weeks.  Please call us at [phone] if you have not heard about the biopsies in 3 weeks.    SIGNATURES/CONFIDENTIALITY: You and/or your care partner have signed paperwork which will be entered into your electronic medical record.  These signatures attest to the fact that that the information above on your After Visit Summary has been reviewed and is understood.  Full responsibility of the confidentiality of this discharge information lies with you and/or your care-partner.

## 2017-07-15 ENCOUNTER — Telehealth: Payer: Self-pay | Admitting: *Deleted

## 2017-07-15 NOTE — Telephone Encounter (Signed)
  Follow up Call-  Call back number 07/12/2017  Post procedure Call Back phone  # [phone]  Permission to leave phone message Yes  Some recent data might be hidden     Patient questions:  Do you have a fever, pain , or abdominal swelling? No. Pain Score  0 *  Have you tolerated food without any problems? Yes.    Have you been able to return to your normal activities? Yes.    Do you have any questions about your discharge instructions: Diet   No. Medications  No. Follow up visit  No.  Do you have questions or concerns about your Care? No.  Actions: * If pain score is 4 or above: No action needed, pain <4.

## 2017-07-17 ENCOUNTER — Encounter: Payer: Self-pay | Admitting: Internal Medicine

## 2017-07-17 NOTE — Progress Notes (Signed)
4 and 10 mm adenomas Recall 2022 My Chart letter

## 2017-07-30 ENCOUNTER — Other Ambulatory Visit: Payer: Self-pay | Admitting: *Deleted

## 2017-07-30 DIAGNOSIS — Q859 Phakomatosis, unspecified: Secondary | ICD-10-CM

## 2017-07-31 ENCOUNTER — Telehealth: Payer: Self-pay | Admitting: Obstetrics and Gynecology

## 2017-07-31 NOTE — Telephone Encounter (Signed)
Patient called stating she has more questions for Dr.Jertson after talking with her yesterday.?

## 2017-07-31 NOTE — Telephone Encounter (Signed)
I called the patient to discuss reasoning for referral. I explained that Hamartoma's are not a frequent diagnosis and from the reading I have done, removal is recommended to remove them to r/o a coexisting malignancy. She understands and appreciates the extra opinion. In addition she told me that at my recommendation she saw the dermatologist, had a skin check and a melanoma was diagnosed.

## 2017-07-31 NOTE — Telephone Encounter (Signed)
Patient called stating she spoke with Gay Filler yesterday regarding breast biopsy results. She was given a lot of information and wants to know why Dr.Jertson feels this area should be removed?  Routed to [place]

## 2017-08-28 ENCOUNTER — Other Ambulatory Visit: Payer: Self-pay | Admitting: Physician Assistant

## 2017-09-02 DIAGNOSIS — Z794 Long term (current) use of insulin: Secondary | ICD-10-CM | POA: Insufficient documentation

## 2017-12-02 ENCOUNTER — Other Ambulatory Visit: Payer: Self-pay | Admitting: General Surgery

## 2017-12-02 DIAGNOSIS — N6310 Unspecified lump in the right breast, unspecified quadrant: Secondary | ICD-10-CM

## 2017-12-06 ENCOUNTER — Other Ambulatory Visit: Payer: Self-pay

## 2018-01-02 ENCOUNTER — Ambulatory Visit
Admission: RE | Admit: 2018-01-02 | Discharge: 2018-01-02 | Disposition: A | Discharge status: Home / Self Care | Payer: BC Managed Care – PPO | Source: Ambulatory Visit | Attending: General Surgery | Admitting: General Surgery

## 2018-01-02 ENCOUNTER — Ambulatory Visit: Payer: Self-pay

## 2018-01-02 DIAGNOSIS — N6310 Unspecified lump in the right breast, unspecified quadrant: Secondary | ICD-10-CM

## 2018-02-26 ENCOUNTER — Other Ambulatory Visit: Payer: Self-pay | Admitting: Physician Assistant

## 2018-02-26 DIAGNOSIS — C4492 Squamous cell carcinoma of skin, unspecified: Secondary | ICD-10-CM

## 2018-02-26 HISTORY — DX: Squamous cell carcinoma of skin, unspecified: C44.92

## 2018-05-19 NOTE — Progress Notes (Signed)
63 y.o. G0P0000 Married White or Caucasian Not Hispanic or Latino female here for annual exam.  No c/o. No bleeding, no bowel or bladder issues.   She has well controlled DM, on an insulin pump.    Patient's last menstrual period was 03/31/2011.          Sexually active: Yes.   Without penetration The current method of family planning is post menopausal status.    Exercising: Yes.    water aerobics, walking Smoker:  no  Health Maintenance: Pap:  04/25/2017 WNL NEG HPV, 12-16-13 WNL NEG HR HPV History of abnormal Pap:  no MMG:  01/02/2018 Birads 2 benign, due for routine screening in March 2020 Colonoscopy:  07/12/2017 4 and 10 mm adenomas recall 2022 BMD:   Never TDaP:  UTD per patient Gardasil: N/A    reports that she has never smoked. She has never used smokeless tobacco. She reports current alcohol use of about 7.0 standard drinks of alcohol per week. She reports that she does not use drugs. Husband is a quadriplegic (since 61), they have been together for 36 years ago. 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 Not sure if she will continue after this school year.   Past Medical History:  Diagnosis Date  . Allergy   . Anemia   . Anxiety   . Arthritis   . Cancer ([place])    melanoma- left clavicle area- surgery scheduled  . Diabetes mellitus type 1 ([place])    age 63  . Fibroid   . Hx of adenomatous polyp of colon 03/14/2012   02/2012 - small adenoma([place]) 07/12/2017 4 and 10 mm polyps   . Hyperlipidemia   . Hypertension   . Melanoma [facility])     Past Surgical History:  Procedure Laterality Date  . BREAST EXCISIONAL BIOPSY Right   . COLONOSCOPY    . DILATION AND CURETTAGE OF UTERUS    . FOOT SURGERY Left age 63  . right breast bx     benign mass 07-02-17  . TONSILLECTOMY AND ADENOIDECTOMY      Current Outpatient Medications  Medication Sig Dispense Refill  . aspirin 81 MG tablet Take 81 mg by mouth daily.    .  carvedilol (COREG) 6.25 MG tablet Take 6.25 mg by mouth 2 (two) times daily with a meal.    . celecoxib (CELEBREX) 200 MG capsule Take 200 mg by mouth daily.    Marland Kitchen losartan-hydrochlorothiazide (HYZAAR) 50-12.5 MG tablet Take 1 tablet by mouth daily.    Marland Kitchen NOVOLOG 100 UNIT/ML injection Insulin pump    . simvastatin (ZOCOR) 40 MG tablet Take 40 mg by mouth daily.    . fluorouracil (EFUDEX) 5 % cream Apply topically 2 (two) times daily. (Patient not taking: Reported on 05/21/2018) 40 g 0  . OVER THE COUNTER MEDICATION Zinc 100 mg daily. Patient may increase to 300 mg as tolerated.     Current Facility-Administered Medications  Medication Dose Route Frequency Provider Last Rate Last Dose  . 0.9 %  sodium chloride infusion  500 mL Intravenous Once Gatha Mayer, MD        Family History  Problem Relation Age of Onset  . Breast cancer Mother 37  . Breast cancer Maternal Aunt 70  . Colon cancer Neg Hx   . Esophageal cancer Neg Hx   . Liver cancer Neg Hx   . Pancreatic cancer Neg Hx   . Rectal cancer Neg  Hx   . Stomach cancer Neg Hx   Mom and MAunt with breast cancer in their 32's. MGM died young of unknown causes. No other family members with breast cancer.   Review of Systems  Constitutional: Negative.   HENT: Negative.   Eyes: Negative.   Respiratory: Negative.   Cardiovascular: Negative.   Gastrointestinal: Negative.   Endocrine: Negative.   Genitourinary: Negative.   Musculoskeletal: Negative.   Skin: Negative.   Allergic/Immunologic: Negative.   Neurological: Negative.   Hematological: Negative.   Psychiatric/Behavioral: Negative.     Exam:   BP 138/80 (BP Location: Right Arm, Patient Position: Sitting, Cuff Size: Normal)   Pulse 72   Ht 5\' 5"  (1.651 m)   Wt 177 lb 9.6 oz (80.6 kg)   LMP 03/31/2011   BMI 29.55 kg/m   Weight change: @WEIGHTCHANGE @ Height:   Height: 5\' 5"  (165.1 cm)  Ht Readings from Last 3 Encounters:  05/21/18 5\' 5"  (1.651 m)  07/12/17 5\' 6"  (1.676 m)   06/28/17 5\' 6"  (1.676 m)    General appearance: alert, cooperative and appears stated age Head: Normocephalic, without obvious abnormality, atraumatic Neck: no adenopathy, supple, symmetrical, trachea midline and thyroid normal to inspection and palpation Lungs: clear to auscultation bilaterally Cardiovascular: regular rate and rhythm Breasts: normal appearance, no masses or tenderness Abdomen: soft, non-tender; non distended,  no masses,  no organomegaly Extremities: extremities normal, atraumatic, no cyanosis or edema Skin: Skin color, texture, turgor normal. No rashes or lesions Lymph nodes: Cervical, supraclavicular, and axillary nodes normal. No abnormal inguinal nodes palpated Neurologic: Grossly normal   Pelvic: External genitalia:  no lesions              Urethra:  normal appearing urethra with no masses, tenderness or lesions              Bartholins and Skenes: normal                 Vagina: normal appearing vagina with normal color and discharge, no lesions              Cervix: no lesions               Bimanual Exam:  Uterus:  normal size, contour, position, consistency, mobility, non-tender              Adnexa: no mass, fullness, tenderness               Rectovaginal: Confirms               Anus:  normal sphincter tone, no lesions  Chaperone was present for exam.  A:  Well Woman with normal exam  Type I DM, well controlled on an insulin pump HTN, hypercholesterolemia, both controlled on medication    P:   No pap this year  Mammogram in 3/20  Colonoscopy in 2022  Labs with primary  Discussed breast self exam  Discussed calcium and vit D intake

## 2018-05-21 ENCOUNTER — Ambulatory Visit: Payer: BC Managed Care – PPO | Admitting: Obstetrics and Gynecology

## 2018-05-21 ENCOUNTER — Other Ambulatory Visit: Payer: Self-pay

## 2018-05-21 ENCOUNTER — Encounter: Payer: Self-pay | Admitting: Obstetrics and Gynecology

## 2018-05-21 VITALS — BP 138/80 | HR 72 | Ht 65.0 in | Wt 177.6 lb

## 2018-05-21 DIAGNOSIS — Z01419 Encounter for gynecological examination (general) (routine) without abnormal findings: Secondary | ICD-10-CM

## 2018-05-21 NOTE — Patient Instructions (Signed)

## 2018-08-12 ENCOUNTER — Other Ambulatory Visit: Payer: Self-pay | Admitting: Obstetrics and Gynecology

## 2018-08-12 DIAGNOSIS — Z1231 Encounter for screening mammogram for malignant neoplasm of breast: Secondary | ICD-10-CM

## 2018-10-13 ENCOUNTER — Other Ambulatory Visit: Payer: Self-pay

## 2018-10-13 ENCOUNTER — Ambulatory Visit
Admission: RE | Admit: 2018-10-13 | Discharge: 2018-10-13 | Disposition: A | Discharge status: Home / Self Care | Payer: BC Managed Care – PPO | Source: Ambulatory Visit | Attending: Obstetrics and Gynecology | Admitting: Obstetrics and Gynecology

## 2018-10-13 DIAGNOSIS — Z1231 Encounter for screening mammogram for malignant neoplasm of breast: Secondary | ICD-10-CM

## 2018-10-28 ENCOUNTER — Other Ambulatory Visit: Payer: Self-pay | Admitting: Physician Assistant

## 2018-10-28 DIAGNOSIS — C4491 Basal cell carcinoma of skin, unspecified: Secondary | ICD-10-CM

## 2018-10-28 HISTORY — DX: Basal cell carcinoma of skin, unspecified: C44.91

## 2018-11-20 ENCOUNTER — Encounter: Payer: Self-pay | Admitting: *Deleted

## 2018-11-22 IMAGING — [facility] DIGITAL DIAGNOSTIC UNILATERAL RIGHT MAMMOGRAM WITH TOMO AND CAD
6 series · 6 of 18 positions shown · non-contrast
Comparison: Previous exam(s).

CLINICAL DATA: Follow-up previously biopsied hamartoma.

EXAM:
DIGITAL DIAGNOSTIC UNILATERAL RIGHT MAMMOGRAM WITH CAD AND TOMO

[R MLO synth-2D]
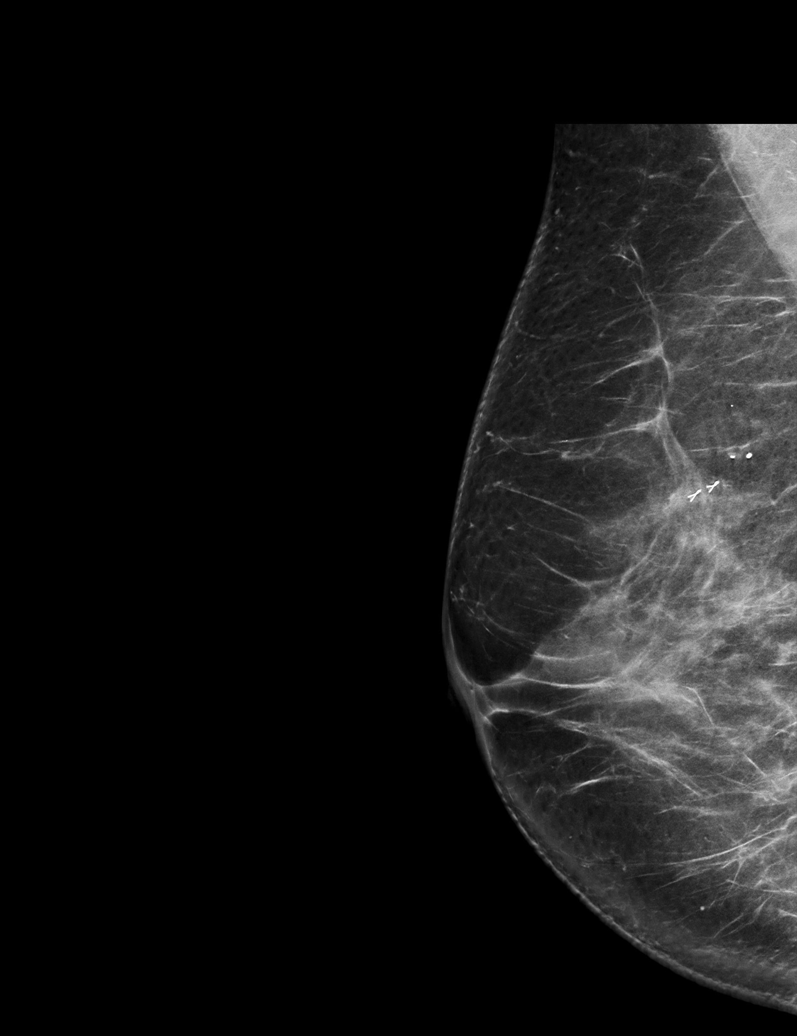

[R XCCL synth-2D]
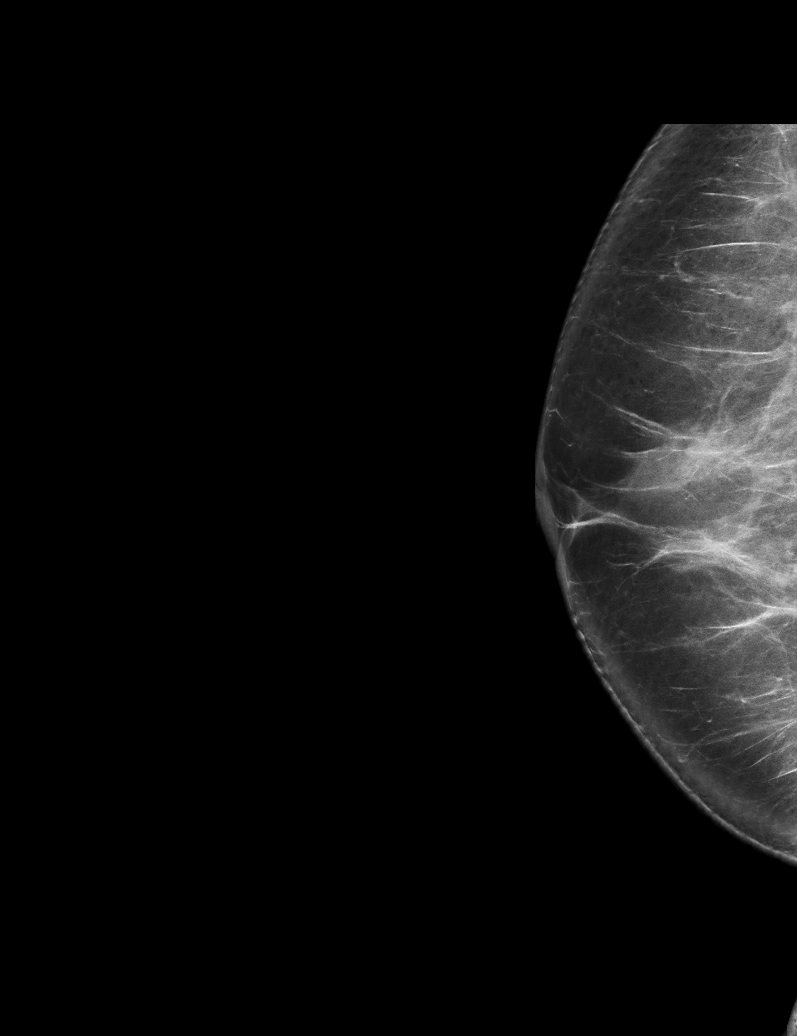

[R CC synth-2D]
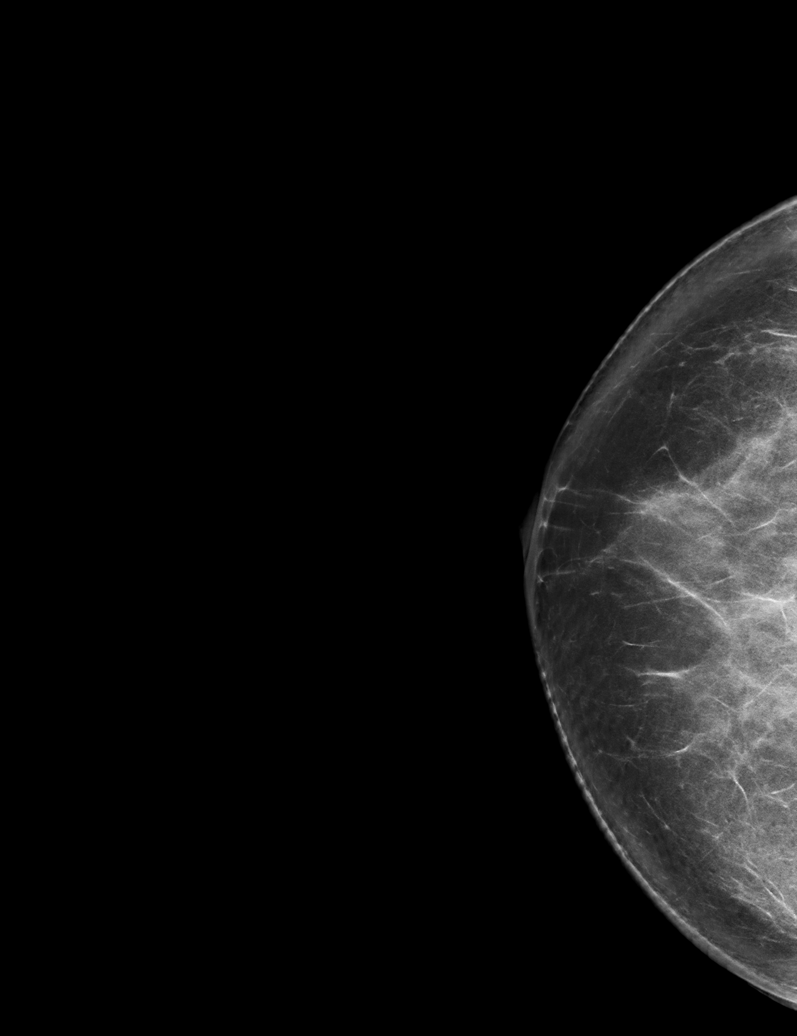

[R MLO tomo · tomo slice 45/90.0]
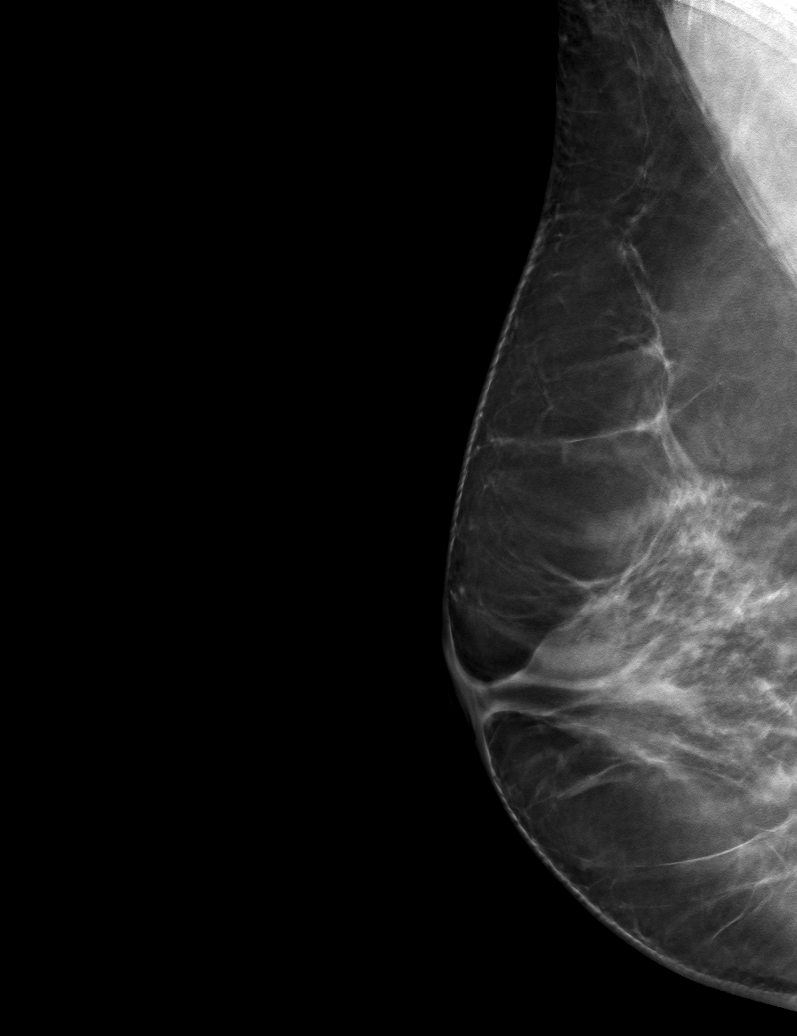

[R XCCL tomo · tomo slice 45/88.0]
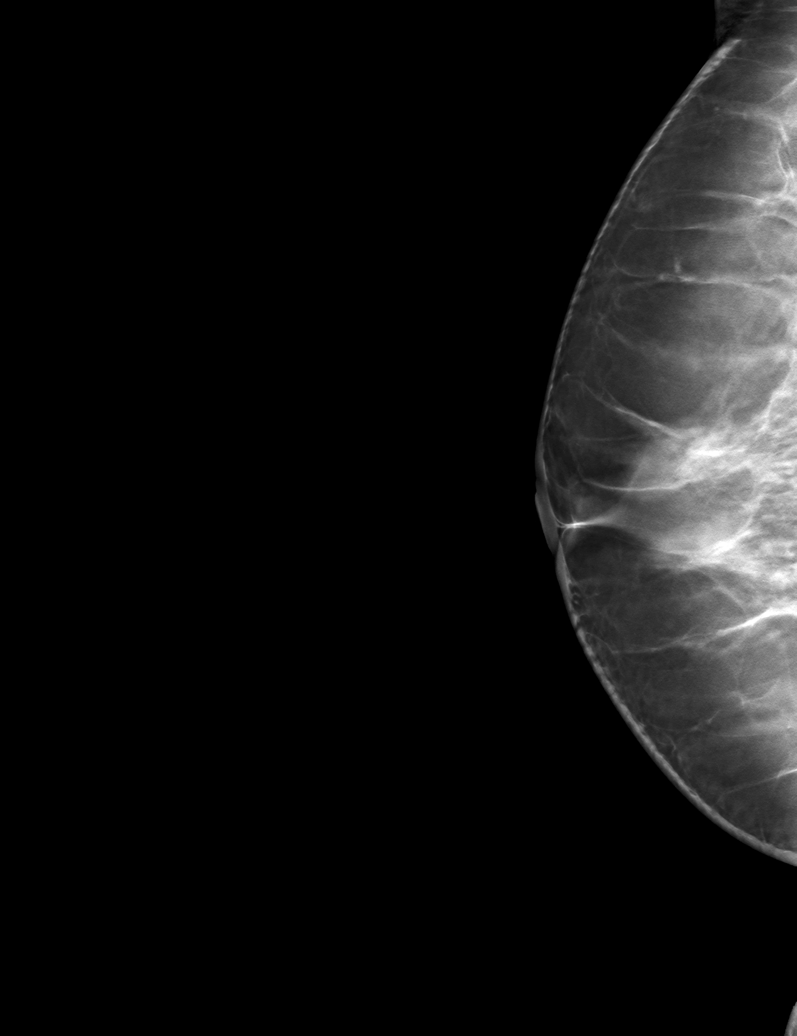

[R CC tomo · tomo slice 47/93.0]
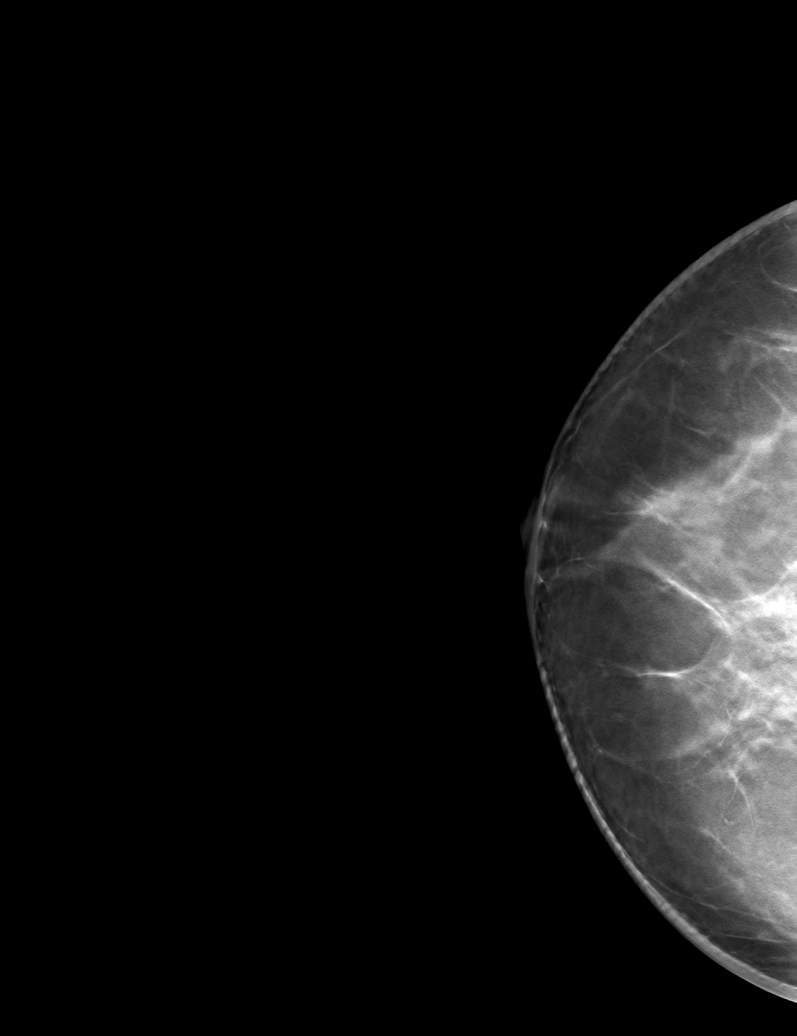

[6 of 18 positions shown; findings below may reference images not displayed]

ACR Breast Density Category c: The breast tissue is heterogeneously
dense, which may obscure small masses.
FINDINGS: The mass in the superior right breast is unchanged. Two biopsy clips
are noted within the mass.

Mammographic images were processed with CAD.
IMPRESSION: No mammographic evidence of malignancy. Biopsy clips within the
previously biopsied hamartoma.

RECOMMENDATION:
Annual screening mammography.

I have discussed the findings and recommendations with the patient.
Results were also provided in writing at the conclusion of the
visit. If applicable, a reminder letter will be sent to the patient
regarding the next appointment.

BI-RADS CATEGORY  2: Benign.

## 2019-06-03 ENCOUNTER — Ambulatory Visit: Payer: BC Managed Care – PPO | Admitting: Obstetrics and Gynecology

## 2019-08-05 ENCOUNTER — Ambulatory Visit: Payer: BC Managed Care – PPO | Admitting: Obstetrics and Gynecology

## 2019-09-15 ENCOUNTER — Encounter: Payer: Self-pay | Admitting: *Deleted

## 2019-09-17 ENCOUNTER — Ambulatory Visit: Payer: BC Managed Care – PPO | Admitting: Physician Assistant

## 2019-09-17 ENCOUNTER — Other Ambulatory Visit: Payer: Self-pay

## 2019-09-17 ENCOUNTER — Encounter: Payer: Self-pay | Admitting: Physician Assistant

## 2019-09-17 DIAGNOSIS — Z86006 Personal history of melanoma in-situ: Secondary | ICD-10-CM

## 2019-09-17 DIAGNOSIS — L814 Other melanin hyperpigmentation: Secondary | ICD-10-CM

## 2019-09-17 DIAGNOSIS — Z1283 Encounter for screening for malignant neoplasm of skin: Secondary | ICD-10-CM | POA: Diagnosis not present

## 2019-09-17 DIAGNOSIS — D229 Melanocytic nevi, unspecified: Secondary | ICD-10-CM

## 2019-09-17 DIAGNOSIS — D1801 Hemangioma of skin and subcutaneous tissue: Secondary | ICD-10-CM | POA: Diagnosis not present

## 2019-09-17 DIAGNOSIS — L821 Other seborrheic keratosis: Secondary | ICD-10-CM

## 2019-09-17 DIAGNOSIS — L720 Epidermal cyst: Secondary | ICD-10-CM | POA: Diagnosis not present

## 2019-09-17 DIAGNOSIS — Z85828 Personal history of other malignant neoplasm of skin: Secondary | ICD-10-CM

## 2019-09-17 DIAGNOSIS — L578 Other skin changes due to chronic exposure to nonionizing radiation: Secondary | ICD-10-CM

## 2019-09-17 NOTE — Progress Notes (Signed)
   Follow-Up Visit   Subjective  Julia Nash is a 64 y.o. female who presents for the following: Annual Exam (no concerns).  The following portions of the chart were reviewed this encounter and updated as appropriate: Tobacco  Allergies  Meds  Problems  Med Hx  Surg Hx  Fam Hx      Objective  Well appearing patient in no apparent distress; mood and affect are within normal limits.  A full examination was performed including scalp, head, eyes, ears, nose, lips, neck, chest, axillae, abdomen, back, buttocks, bilateral upper extremities, bilateral lower extremities, hands, feet, fingers, toes, fingernails, and toenails. All findings within normal limits unless otherwise noted below. No atypical nevi noted at the time of the visit.  Objective  Left Breast: Open comedome  Images    Objective  Head - Anterior (Face), Left Shoulder - Anterior: Scar clear  Objective  tip of shoulder: clear  Objective  Left chest: Scar clear  Objective  Head - to toe: Head to toe  Assessment & Plan  Epidermal cyst Left Breast  Skin / nail biopsy - Left Breast Type of biopsy: punch   Informed consent: discussed and consent obtained   Procedure prep:  Patient was prepped and draped in usual sterile fashion (nonsterile) Prep type:  Chlorhexidine Anesthesia: the lesion was anesthetized in a standard fashion   Anesthetic:  1% lidocaine w/ epinephrine 1-100,000 local infiltration Punch size:  5 mm Suture type: nylon   Suture removal (days):  14 Hemostasis achieved with: suture   Outcome: patient tolerated procedure well   Post-procedure details: wound care instructions given   Post-procedure details comment:  Nonsterile  Specimen 1 - Surgical pathology Differential Diagnosis: cyst Check Margins: No Open comedome  History of SCC (squamous cell carcinoma) of skin (2) Head - Anterior (Face); Left Shoulder - Anterior  observe  History of basal cell carcinoma (BCC) tip  of shoulder  observe  Personal history of melanoma in-situ Left chest  Q 6 mo skin exam  Screening exam for skin cancer Head - to toe  6 months Lentigines - Scattered tan macules - Discussed due to sun exposure - Benign, observe - Call for any changes  Seborrheic Keratoses - Stuck-on, waxy, tan-brown papules and plaques  - Discussed benign etiology and prognosis. - Observe - Call for any changes  Melanocytic Nevi - Tan-brown and/or pink-flesh-colored symmetric macules and papules - Benign appearing on exam today - Observation - Call clinic for new or changing moles - Recommend daily use of broad spectrum spf 30+ sunscreen to sun-exposed areas.   Hemangiomas - Red papules - Discussed benign nature - Observe - Call for any changes  Actinic Damage - diffuse scaly erythematous macules with underlying dyspigmentation - Recommend daily broad spectrum sunscreen SPF 30+ to sun-exposed areas, reapply every 2 hours as needed.  - Call for new or changing lesions.  Skin cancer screening performed today.

## 2019-09-17 NOTE — Patient Instructions (Signed)

## 2019-09-23 ENCOUNTER — Other Ambulatory Visit: Payer: Self-pay

## 2019-09-23 NOTE — Progress Notes (Deleted)
64 y.o. G0P0000 Married White or Caucasian Not Hispanic or Latino female here for annual exam.      Patient's last menstrual period was 03/31/2011.          Sexually active: {yes no:314532}  The current method of family planning is {contraception:315051}.    Exercising: {yes no:314532}  {types:19826} Smoker:  {YES V2345720  Health Maintenance: Pap:  04/25/2017 WNL NEG HPV, 12-16-13 WNL NEG HR HPV History of abnormal Pap:  no MMG:  10/13/18 Bi-rads 1 neg  BMD:   never Colonoscopy: 07/12/2017 4 and 10 mm adenomas recall 2022 TDaP:  *** Gardasil: NA   reports that she has never smoked. She has never used smokeless tobacco. She reports current alcohol use of about 7.0 standard drinks of alcohol per week. She reports that she does not use drugs.  Past Medical History:  Diagnosis Date  . Allergy   . Anemia   . Anxiety   . Arthritis   . BCC (basal cell carcinoma of skin) 10/28/2018   SUPERFICIAL- LEFT TIP OF SHOULDER- TX CURET AFTER BIOPSY  . Diabetes mellitus type 1 ([place])    age 62  . Fibroid   . Hx of adenomatous polyp of colon 03/14/2012   02/2012 - [place]oma([place]) 07/12/2017 4 and 10 mm polyps   . Hyperlipidemia   . Hypertension   . Melanoma ([place]) 06/21/2017   IN SITU- MID CHEST- TX MOHS  . SCCA (squamous cell carcinoma) of skin 02/26/2018   UPPER FOREHEAD MID- TX CURET X 3, 5FU  . SCCA (squamous cell carcinoma) of skin 10/28/2018   WELL DIFF- RIGHT FOREHEAD- TX CURET AFTER BIOPSY    Past Surgical History:  Procedure Laterality Date  . BREAST EXCISIONAL BIOPSY Right   . COLONOSCOPY    . DILATION AND CURETTAGE OF UTERUS    . FOOT SURGERY Left age 31  . right breast bx     benign mass 07-02-17  . TONSILLECTOMY AND ADENOIDECTOMY      Current Outpatient Medications  Medication Sig Dispense Refill  . aspirin 81 MG tablet Take 81 mg by mouth daily.    . carvedilol (COREG) 6.25 MG tablet Take 6.25 mg by mouth 2 (two) times daily with a meal.    . celecoxib (CELEBREX) 200  MG capsule Take 200 mg by mouth daily.    . fluorouracil (EFUDEX) 5 % cream Apply topically 2 (two) times daily. 40 g 0  . losartan-hydrochlorothiazide (HYZAAR) 50-12.5 MG tablet Take 1 tablet by mouth daily.    Marland Kitchen NOVOLOG 100 UNIT/ML injection Insulin pump    . OVER THE COUNTER MEDICATION Zinc 100 mg daily. Patient may increase to 300 mg as tolerated.    . simvastatin (ZOCOR) 40 MG tablet Take 40 mg by mouth daily.     Current Facility-Administered Medications  Medication Dose Route Frequency Provider Last Rate Last Admin  . 0.9 %  sodium chloride infusion  500 mL Intravenous Once Gatha Mayer, MD        Family History  Problem Relation Age of Onset  . Breast cancer Mother 45  . Breast cancer Maternal Aunt 70  . Colon cancer Neg Hx   . Esophageal cancer Neg Hx   . Liver cancer Neg Hx   . Pancreatic cancer Neg Hx   . Rectal cancer Neg Hx   . Stomach cancer Neg Hx     Review of Systems  Exam:   LMP 03/31/2011   Weight change: @WEIGHTCHANGE @ Height:  Ht Readings from Last 3 Encounters:  05/21/18 5\' 5"  (1.651 m)  07/12/17 5\' 6"  (1.676 m)  06/28/17 5\' 6"  (1.676 m)    General appearance: alert, cooperative and appears stated age Head: Normocephalic, without obvious abnormality, atraumatic Neck: no adenopathy, supple, symmetrical, trachea midline and thyroid {CHL AMB PHY EX THYROID NORM [phone]::"normal to inspection and palpation"} Lungs: clear to auscultation bilaterally Cardiovascular: regular rate and rhythm Breasts: {Exam; breast:13139::"normal appearance, no masses or tenderness"} Abdomen: soft, non-tender; non distended,  no masses,  no organomegaly Extremities: extremities normal, atraumatic, no cyanosis or edema Skin: Skin color, texture, turgor normal. No rashes or lesions Lymph nodes: Cervical, supraclavicular, and axillary nodes normal. No abnormal inguinal nodes palpated Neurologic: Grossly normal   Pelvic: External genitalia:  no lesions               Urethra:  normal appearing urethra with no masses, tenderness or lesions              Bartholins and Skenes: normal                 Vagina: normal appearing vagina with normal color and discharge, no lesions              Cervix: {CHL AMB PHY EX CERVIX NORM DEFAULT:[phone]::"no lesions"}               Bimanual Exam:  Uterus:  {CHL AMB PHY EX UTERUS NORM DEFAULT:[phone]::"normal size, contour, position, consistency, mobility, non-tender"}              Adnexa: {CHL AMB PHY EX ADNEXA NO MASS [phone]::"no mass, fullness, tenderness"}               Rectovaginal: Confirms               Anus:  normal sphincter tone, no lesions  *** chaperoned for the exam.  A:  Well Woman with normal exam  P:

## 2019-09-24 ENCOUNTER — Ambulatory Visit: Payer: BC Managed Care – PPO | Admitting: Obstetrics and Gynecology

## 2019-09-25 ENCOUNTER — Ambulatory Visit: Payer: BC Managed Care – PPO | Admitting: Obstetrics and Gynecology

## 2019-09-25 ENCOUNTER — Encounter: Payer: Self-pay | Admitting: Obstetrics and Gynecology

## 2019-09-25 ENCOUNTER — Other Ambulatory Visit: Payer: Self-pay

## 2019-09-25 VITALS — BP 118/62 | HR 76 | Temp 98.1°F | Ht 65.0 in | Wt 185.0 lb

## 2019-09-25 DIAGNOSIS — Z8489 Family history of other specified conditions: Secondary | ICD-10-CM

## 2019-09-25 DIAGNOSIS — Z01419 Encounter for gynecological examination (general) (routine) without abnormal findings: Secondary | ICD-10-CM | POA: Diagnosis not present

## 2019-09-25 DIAGNOSIS — E2839 Other primary ovarian failure: Secondary | ICD-10-CM

## 2019-09-25 NOTE — Patient Instructions (Signed)

## 2019-09-25 NOTE — Progress Notes (Signed)
64 y.o. G0P0000 Married White or Caucasian Not Hispanic or Latino female here for annual exam.     H/o Type I DM, on an insulin pump. HgbA1C was 6.5% recently.   She is recovering from shingles. Doing better.   Patient's last menstrual period was 03/31/2011.          Sexually active: No.  The current method of family planning is post menopausal status.    Exercising: Yes.    walking and aerobics Smoker:  no  Health Maintenance: Pap:  04/25/17 Neg:Neg HR HPV History of abnormal Pap:  no MMG:  10/13/18 BIRADS 1 negative/density b BMD:   never Colonoscopy: 07/12/17 4 and 10 mm adenomas recall 2022 TDaP:  UTD per patient  Gardasil: n/a   reports that she has never smoked. She has never used smokeless tobacco. She reports current alcohol use of about 7.0 standard drinks of alcohol per week. She reports that she does not use drugs. Husband is a quadriplegic (since 95), they have been together for 37 years ago. 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  Past Medical History:  Diagnosis Date  . Allergy   . Anemia   . Anxiety   . Arthritis   . BCC (basal cell carcinoma of skin) 10/28/2018   SUPERFICIAL- LEFT TIP OF SHOULDER- TX CURET AFTER BIOPSY  . Diabetes mellitus type 1 ([place])    age 5  . Fibroid   . Hx of adenomatous polyp of colon 03/14/2012   02/2012 - [place]) 07/12/2017 4 and 10 mm polyps   . Hyperlipidemia   . Hypertension   . Melanoma ([place]) 06/21/2017   IN SITU- MID CHEST- TX MOHS  . SCCA (squamous cell carcinoma) of skin 02/26/2018   UPPER FOREHEAD MID- TX CURET X 3, 5FU  . SCCA (squamous cell carcinoma) of skin 10/28/2018   WELL DIFF- RIGHT FOREHEAD- TX CURET AFTER BIOPSY  . Shingles     Past Surgical History:  Procedure Laterality Date  . BREAST EXCISIONAL BIOPSY Right   . COLONOSCOPY    . DILATION AND CURETTAGE OF UTERUS    . FOOT SURGERY Left  age 27  . right breast bx     benign mass 07-02-17  . TONSILLECTOMY AND ADENOIDECTOMY      Current Outpatient Medications  Medication Sig Dispense Refill  . aspirin 81 MG tablet Take 81 mg by mouth daily.    . carvedilol (COREG) 6.25 MG tablet Take 6.25 mg by mouth 2 (two) times daily with a meal.    . celecoxib (CELEBREX) 200 MG capsule Take 200 mg by mouth daily.    . fluorouracil (EFUDEX) 5 % cream Apply topically 2 (two) times daily. 40 g 0  . losartan-hydrochlorothiazide (HYZAAR) 50-12.5 MG tablet Take 1 tablet by mouth daily.    Marland Kitchen NOVOLOG 100 UNIT/ML injection Insulin pump    . simvastatin (ZOCOR) 40 MG tablet Take 40 mg by mouth daily.    Marland Kitchen OVER THE COUNTER MEDICATION Zinc 100 mg daily. Patient may increase to 300 mg as tolerated.     Current Facility-Administered Medications  Medication Dose Route Frequency Provider Last Rate Last Admin  . 0.9 %  sodium chloride infusion  500 mL Intravenous Once Gatha Mayer, MD        Family History  Problem Relation Age of Onset  . Breast cancer Mother 108  . Breast cancer Maternal Aunt  70  . Colon cancer Neg Hx   . Esophageal cancer Neg Hx   . Liver cancer Neg Hx   . Pancreatic cancer Neg Hx   . Rectal cancer Neg Hx   . Stomach cancer Neg Hx   Father with hip fracture x 2  Review of Systems  Constitutional: Negative.   HENT: Negative.   Eyes: Negative.   Respiratory: Negative.   Cardiovascular: Negative.   Gastrointestinal: Negative.   Endocrine: Negative.   Genitourinary: Negative.   Musculoskeletal: Negative.   Skin: Negative.   Allergic/Immunologic: Negative.   Neurological: Negative.   Hematological: Negative.   Psychiatric/Behavioral: Negative.     Exam:   BP 118/62 (BP Location: Right Arm, Patient Position: Sitting, Cuff Size: Normal)   Pulse 76   Temp 98.1 F (36.7 C) (Temporal)   Ht 5\' 5"  (1.651 m)   Wt 185 lb (83.9 kg)   LMP 03/31/2011   BMI 30.79 kg/m   Weight change: @WEIGHTCHANGE @ Height:   Height: 5'  5" (165.1 cm)  Ht Readings from Last 3 Encounters:  09/25/19 5\' 5"  (1.651 m)  05/21/18 5\' 5"  (1.651 m)  07/12/17 5\' 6"  (1.676 m)    General appearance: alert, cooperative and appears stated age Head: Normocephalic, without obvious abnormality, atraumatic Neck: no adenopathy, supple, symmetrical, trachea midline and thyroid normal to inspection and palpation Lungs: clear to auscultation bilaterally Cardiovascular: regular rate and rhythm Breasts: normal appearance, no masses or tenderness Abdomen: soft, non-tender; non distended,  no masses,  no organomegaly Extremities: extremities normal, atraumatic, no cyanosis or edema Skin: Skin color, texture, turgor normal. No rashes or lesions Lymph nodes: Cervical, supraclavicular, and axillary nodes normal. No abnormal inguinal nodes palpated Neurologic: Grossly normal   Pelvic: External genitalia:  no lesions              Urethra:  normal appearing urethra with no masses, tenderness or lesions              Bartholins and Skenes: normal                 Vagina: atrophic appearing vagina with normal color and discharge, no lesions              Cervix: no lesions               Bimanual Exam:  Uterus:  no msses or tenderness              Adnexa: no mass, fullness, tenderness               Rectovaginal: Confirms               Anus:  normal sphincter tone, no lesions  Terence Lux chaperoned for the exam.  A:  Well Woman with normal exam  DM, HTN, lipids all managed by her primary  FH hip fracture  P:   No pap this year  Mammogram next month  Discussed breast self exam  Discussed calcium and vit D intake  Colonoscopy next year  Labs with primary

## 2019-09-28 ENCOUNTER — Encounter: Payer: Self-pay | Admitting: Physician Assistant

## 2019-09-29 ENCOUNTER — Ambulatory Visit: Payer: BC Managed Care – PPO

## 2019-10-14 ENCOUNTER — Other Ambulatory Visit: Payer: Self-pay | Admitting: Obstetrics and Gynecology

## 2019-10-14 DIAGNOSIS — Z1231 Encounter for screening mammogram for malignant neoplasm of breast: Secondary | ICD-10-CM

## 2019-10-14 DIAGNOSIS — Z8489 Family history of other specified conditions: Secondary | ICD-10-CM

## 2019-10-14 DIAGNOSIS — E2839 Other primary ovarian failure: Secondary | ICD-10-CM

## 2019-12-30 ENCOUNTER — Ambulatory Visit
Admission: RE | Admit: 2019-12-30 | Discharge: 2019-12-30 | Disposition: A | Discharge status: Home / Self Care | Payer: BC Managed Care – PPO | Source: Ambulatory Visit | Attending: Obstetrics and Gynecology | Admitting: Obstetrics and Gynecology

## 2019-12-30 ENCOUNTER — Other Ambulatory Visit: Payer: Self-pay

## 2019-12-30 DIAGNOSIS — E2839 Other primary ovarian failure: Secondary | ICD-10-CM

## 2019-12-30 DIAGNOSIS — Z8489 Family history of other specified conditions: Secondary | ICD-10-CM

## 2019-12-30 DIAGNOSIS — Z1231 Encounter for screening mammogram for malignant neoplasm of breast: Secondary | ICD-10-CM

## 2020-01-14 ENCOUNTER — Telehealth: Payer: Self-pay

## 2020-01-14 ENCOUNTER — Encounter: Payer: Self-pay | Admitting: Obstetrics and Gynecology

## 2020-01-14 NOTE — Telephone Encounter (Signed)
Routing to Dr Talbert Nan for BMD results and recommendations.

## 2020-01-14 NOTE — Telephone Encounter (Signed)
Pt sent following Mychart message:  Julia, Carlino Nash Clinical Pool One question re the bone density test   It said -  L-2 and L-3 were excluded due to degenerative changes    Wondering about the severity of those changes   Thanks  [name]

## 2020-01-14 NOTE — Telephone Encounter (Signed)
Results and any recommendations were sent via MyChart.  

## 2020-01-25 ENCOUNTER — Encounter: Payer: Self-pay | Admitting: Physician Assistant

## 2020-01-25 ENCOUNTER — Other Ambulatory Visit: Payer: Self-pay | Admitting: Physician Assistant

## 2020-01-25 DIAGNOSIS — R21 Rash and other nonspecific skin eruption: Secondary | ICD-10-CM

## 2020-01-25 MED ORDER — TRIAMCINOLONE ACETONIDE 0.1 % EX CREA: 1.0000 "application " | TOPICAL_CREAM | Freq: Four times a day (QID) | CUTANEOUS | 3 refills | Status: DC

## 2020-06-03 ENCOUNTER — Ambulatory Visit: Payer: BC Managed Care – PPO | Admitting: Physician Assistant

## 2020-06-03 ENCOUNTER — Other Ambulatory Visit: Payer: Self-pay

## 2020-06-03 DIAGNOSIS — D485 Neoplasm of uncertain behavior of skin: Secondary | ICD-10-CM

## 2020-06-03 DIAGNOSIS — Z1283 Encounter for screening for malignant neoplasm of skin: Secondary | ICD-10-CM

## 2020-06-03 DIAGNOSIS — D489 Neoplasm of uncertain behavior, unspecified: Secondary | ICD-10-CM

## 2020-06-03 DIAGNOSIS — Z85820 Personal history of malignant melanoma of skin: Secondary | ICD-10-CM | POA: Diagnosis not present

## 2020-06-03 DIAGNOSIS — Z85828 Personal history of other malignant neoplasm of skin: Secondary | ICD-10-CM

## 2020-06-03 DIAGNOSIS — D0439 Carcinoma in situ of skin of other parts of face: Secondary | ICD-10-CM | POA: Diagnosis not present

## 2020-06-03 DIAGNOSIS — L570 Actinic keratosis: Secondary | ICD-10-CM

## 2020-06-03 DIAGNOSIS — Z8589 Personal history of malignant neoplasm of other organs and systems: Secondary | ICD-10-CM

## 2020-06-03 NOTE — Patient Instructions (Signed)

## 2020-06-13 ENCOUNTER — Telehealth: Payer: Self-pay | Admitting: Physician Assistant

## 2020-06-13 NOTE — Telephone Encounter (Signed)
Phone call to patient to inform her that once Robyne Askew, PA-C reviews her pathology results we will give her a call back.

## 2020-06-13 NOTE — Telephone Encounter (Signed)
Patient left message on office voice mail saying that she was calling for pathology results from her last visit with [facility], PA-C.

## 2020-06-14 ENCOUNTER — Telehealth: Payer: Self-pay

## 2020-06-14 NOTE — Telephone Encounter (Signed)
Path to patient front staff to make visit

## 2020-06-14 NOTE — Telephone Encounter (Signed)
-----   Message from Warren Danes, [place] sent at 06/14/2020 11:31 AM EST ----- 15 min surgery

## 2020-06-14 NOTE — Telephone Encounter (Signed)
LEFT MESSAGE TO CALL OFFICE

## 2020-06-20 ENCOUNTER — Encounter: Payer: Self-pay | Admitting: Physician Assistant

## 2020-06-20 NOTE — Telephone Encounter (Signed)
Left patient a message she was added on 2/23 at 3:15 arrive at 3:00

## 2020-06-20 NOTE — Telephone Encounter (Signed)
Please add her on to the end of a day

## 2020-06-20 NOTE — Progress Notes (Signed)
   Follow-Up Visit   Subjective  Julia Nash is a 64 y.o. female who presents for the following: Annual Exam (nO concerns).   The following portions of the chart were reviewed this encounter and updated as appropriate:  Tobacco  Allergies  Meds  Problems  Med Hx  Surg Hx  Fam Hx      Objective  Well appearing patient in no apparent distress; mood and affect are within normal limits.  A full examination was performed including scalp, head, eyes, ears, nose, lips, neck, chest, axillae, abdomen, back, buttocks, bilateral upper extremities, bilateral lower extremities, hands, feet, fingers, toes, fingernails, and toenails. All findings within normal limits unless otherwise noted below.  Objective  Mid Chest: White scar- clear  Objective  upper forehead mid: White scar- clear  Objective  Left Tip of Shoulder: White scar- clear  Objective  Left Buccal Cheek, Left Forearm - Posterior, Left Forehead, Left Upper Arm - Posterior, Right Breast, Right Dorsal Hand, Right Forearm - Anterior (2): Erythematous patches with gritty scale.  Objective  Left Malar Cheek: Pink pearly papule      Assessment & Plan  Screening exam for skin cancer Chest - Medial [facility])  Personal history of malignant melanoma of skin Mid Chest  Yearly skin check  History of squamous cell carcinoma upper forehead mid  Yearly skin check  History of basal cell cancer Left Tip of Shoulder  Yearly skin check  AK (actinic keratosis) (8) Left Upper Arm - Posterior; Right Forearm - Anterior (2); Left Forearm - Posterior; Right Breast; Right Dorsal Hand; Left Buccal Cheek; Left Forehead  Destruction of lesion - Left Buccal Cheek, Left Forearm - Posterior, Left Forehead, Left Upper Arm - Posterior, Right Breast, Right Dorsal Hand, Right Forearm - Anterior (2) Complexity: simple   Destruction method: cryotherapy   Informed consent: discussed and consent obtained   Timeout:  patient  name, date of birth, surgical site, and procedure verified Lesion destroyed using liquid nitrogen: Yes   Cryotherapy cycles:  3 Outcome: patient tolerated procedure well with no complications    Neoplasm of uncertain behavior Left Malar Cheek  Skin / nail biopsy Type of biopsy: tangential   Informed consent: discussed and consent obtained   Timeout: patient name, date of birth, surgical site, and procedure verified   Procedure prep:  Patient was prepped and draped in usual sterile fashion (Non sterile) Prep type:  Chlorhexidine Anesthesia: the lesion was anesthetized in a standard fashion   Anesthetic:  1% lidocaine w/ epinephrine 1-100,000 local infiltration Instrument used: flexible razor blade   Outcome: patient tolerated procedure well   Post-procedure details: wound care instructions given   Additional details:  Cautery only  Specimen 1 - Surgical pathology Differential Diagnosis: bcc vs scc  Check Margins: No    I, Jelisha Weed, PA-C, have reviewed all documentation's for this visit.  The documentation on 06/20/20 for the exam, diagnosis, procedures and orders are all accurate and complete.

## 2020-06-22 ENCOUNTER — Other Ambulatory Visit: Payer: Self-pay

## 2020-06-22 ENCOUNTER — Ambulatory Visit: Payer: BC Managed Care – PPO | Admitting: Physician Assistant

## 2020-06-22 ENCOUNTER — Encounter: Payer: Self-pay | Admitting: Physician Assistant

## 2020-06-22 DIAGNOSIS — D0439 Carcinoma in situ of skin of other parts of face: Secondary | ICD-10-CM

## 2020-06-22 DIAGNOSIS — D099 Carcinoma in situ, unspecified: Secondary | ICD-10-CM

## 2020-06-22 NOTE — Patient Instructions (Signed)

## 2020-07-19 ENCOUNTER — Encounter: Payer: Self-pay | Admitting: Physician Assistant

## 2020-07-19 NOTE — Progress Notes (Signed)
   Follow-Up Visit   Subjective  Julia Nash is a 65 y.o. female who presents for the following: Follow-up (Bx on face seen on previous visit.).   The following portions of the chart were reviewed this encounter and updated as appropriate:  Tobacco  Allergies  Meds  Problems  Med Hx  Surg Hx  Fam Hx      Objective  Well appearing patient in no apparent distress; mood and affect are within normal limits.  A focused examination was performed including face. Relevant physical exam findings are noted in the Assessment and Plan.  Objective  Left Malar Cheek: Pink macule. Lesion identified by Robyne Askew, PA-C and nurse in room.     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  Destruction of lesion Complexity: simple   Destruction method: electrodesiccation and curettage   Informed consent: discussed and consent obtained   Timeout:  patient name, date of birth, surgical site, and procedure verified Anesthesia: the lesion was anesthetized in a standard fashion   Anesthetic:  1% lidocaine w/ epinephrine 1-100,000 local infiltration Curettage performed in three different directions: Yes   Electrodesiccation performed over the curetted area: Yes   Curettage cycles:  3 Margin per side (cm):  0.1 Final wound size (cm):  1 Hemostasis achieved with:  aluminum chloride and ferric subsulfate Outcome: patient tolerated procedure well with no complications   Post-procedure details: sterile dressing applied and wound care instructions given   Dressing type: bandage and petrolatum   Additional details:  Wound innoculated with 5 fluorouracil solution.    I, Afreen Siebels, PA-C, have reviewed all documentation's for this visit.  The documentation on 07/19/20 for the exam, diagnosis, procedures and orders are all accurate and complete.

## 2020-08-10 ENCOUNTER — Encounter: Payer: Self-pay | Admitting: Internal Medicine

## 2020-09-14 ENCOUNTER — Other Ambulatory Visit: Payer: Self-pay

## 2020-09-14 ENCOUNTER — Other Ambulatory Visit: Payer: Self-pay | Admitting: Physician Assistant

## 2020-09-14 ENCOUNTER — Encounter: Payer: Self-pay | Admitting: Physician Assistant

## 2020-09-14 ENCOUNTER — Ambulatory Visit (INDEPENDENT_AMBULATORY_CARE_PROVIDER_SITE_OTHER): Payer: BC Managed Care – PPO | Admitting: Physician Assistant

## 2020-09-14 DIAGNOSIS — D485 Neoplasm of uncertain behavior of skin: Secondary | ICD-10-CM | POA: Diagnosis not present

## 2020-09-14 NOTE — Patient Instructions (Signed)

## 2020-09-14 NOTE — Progress Notes (Signed)
   Follow-Up Visit   Subjective  Julia Nash is a 65 y.o. female who presents for the following: Follow-up (Patient here today for follow up on left malar cheek that was treated on 06/22/2020. No new concerns. ).   The following portions of the chart were reviewed this encounter and updated as appropriate:      Objective  Well appearing patient in no apparent distress; mood and affect are within normal limits.  A focused examination was performed including face. Relevant physical exam findings are noted in the Assessment and Plan.  Objective  Left Malar Cheek: Hyperkeratotic scale with pink base    Assessment & Plan  Neoplasm of uncertain behavior of skin Left Malar Cheek  Skin / nail biopsy Type of biopsy: punch   Informed consent: discussed and consent obtained   Timeout: patient name, date of birth, surgical site, and procedure verified   Procedure prep:  Patient was prepped and draped in usual sterile fashion (Non sterile) Prep type:  Chlorhexidine Anesthesia: the lesion was anesthetized in a standard fashion   Anesthetic:  1% lidocaine w/ epinephrine 1-100,000 local infiltration Punch size:  3.5 mm Suture size:  5-0 Suture type: nylon   Suture removal (days):  7 Hemostasis achieved with: suture   Outcome: patient tolerated procedure well   Post-procedure details: sterile dressing applied and wound care instructions given   Dressing type: bandage and petrolatum   Additional details:  2 sutures  Specimen 1 - Surgical pathology Differential Diagnosis: R/O [facility] [id number] Check Margins: Yes    I, Juanetta Negash, PA-C, have reviewed all documentation's for this visit.  The documentation on 09/14/20 for the exam, diagnosis, procedures and orders are all accurate and complete.

## 2020-09-21 ENCOUNTER — Telehealth: Payer: Self-pay | Admitting: *Deleted

## 2020-09-21 NOTE — Telephone Encounter (Signed)
-----   Message from Warren Danes, [place] sent at 09/20/2020  6:24 PM EDT ----- RTC if recurs

## 2020-09-21 NOTE — Telephone Encounter (Signed)
Left message for patient to call us back.  

## 2020-09-22 ENCOUNTER — Ambulatory Visit: Payer: BC Managed Care – PPO

## 2020-09-29 ENCOUNTER — Ambulatory Visit: Payer: BC Managed Care – PPO | Admitting: Obstetrics and Gynecology

## 2020-10-07 ENCOUNTER — Telehealth: Payer: Self-pay

## 2020-10-07 NOTE — Telephone Encounter (Signed)
Julia Nash, This patient is scheduled for a pre visit on 10/18/2020. An insulin pump is listed on her medication list. Will you please obtain information regarding insulin pump instructions for her procedure scheduled on 11/01/2020 with Dr. Carlean Purl. Please/thank you

## 2020-10-07 NOTE — Telephone Encounter (Signed)
I called and left a detailed message on her cell # for her to call us Monday with the [place] name that manages her insulin pump.

## 2020-10-10 NOTE — Telephone Encounter (Signed)
Dr Osborne Casco does her insulin pump, we will fax him a letter today to get instructions.

## 2020-10-10 NOTE — Progress Notes (Signed)
65 y.o. G0P0000 Married White or Caucasian Not Hispanic or Latino female here for annual exam.   She c/o intermittent vaginal irritation and burning. No discharge. Seems to be helped with over the counter yeast cream. Last used it a couple of days ago. Still with mild symptoms.  No vaginal bleeding.   H/O Type I DM, on insulin pump. HgbA1C is in the 6's.   H/O melanoma (2019)    Patient's last menstrual period was 03/31/2011.          Sexually active: No.  The current method of family planning is none.    Exercising: yes  Smoker:  no  Health Maintenance: Pap:  04-25-17 normal Neg HPV History of abnormal Pap:  no MMG:  12-30-19 normal BMD:   12-30-19 Mild Osteopenia,  T score -1.2, FRAX 9.8/0.8%. Repeat in 5 years Colonoscopy: 07-12-17 adenomas-recall 2022, scheduled in July  TDaP:2022 Gardasil: N/A   reports that she has never smoked. She has never used smokeless tobacco. She reports current alcohol use of about 7.0 standard drinks of alcohol per week. She reports that she does not use drugs.Husband is a quadriplegic (since 9), they have been together for 38 years. 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  Past Medical History:  Diagnosis Date   Allergy    Anemia    Anxiety    Arthritis    BCC (basal cell carcinoma of skin) 10/28/2018   SUPERFICIAL- LEFT TIP OF SHOULDER- TX CURET AFTER BIOPSY   Diabetes mellitus type 1 ([place])    age 47   Fibroid    Hx of adenomatous polyp of colon 03/14/2012   02/[place] - [place]) 07/12/2017 4 and 10 mm polyps    Hyperlipidemia    Hypertension    Melanoma ([place]) 06/21/2017   IN SITU- MID CHEST- TX MOHS   SCCA (squamous cell carcinoma) of skin 02/26/2018   UPPER FOREHEAD MID- TX CURET X 3, 5FU   SCCA (squamous cell carcinoma) of skin 10/28/2018   WELL DIFF- RIGHT FOREHEAD- TX CURET AFTER BIOPSY   Shingles     Past Surgical History:  Procedure Laterality Date   BREAST EXCISIONAL BIOPSY Right    COLONOSCOPY     DILATION  AND CURETTAGE OF UTERUS     FOOT SURGERY Left age 80   right breast bx     benign mass 07-02-17   TONSILLECTOMY AND ADENOIDECTOMY      Current Outpatient Medications  Medication Sig Dispense Refill   aspirin 81 MG tablet Take 81 mg by mouth daily.     carvedilol (COREG) 6.25 MG tablet Take 6.25 mg by mouth 2 (two) times daily with a meal.     celecoxib (CELEBREX) 200 MG capsule Take 200 mg by mouth daily.     losartan-hydrochlorothiazide (HYZAAR) 50-12.5 MG tablet Take 1 tablet by mouth daily.     NOVOLOG 100 UNIT/ML injection Insulin pump     polycarbophil (FIBERCON) 625 MG tablet 2 tablets as needed     simvastatin (ZOCOR) 40 MG tablet Take 40 mg by mouth daily.     triamcinolone cream (KENALOG) 0.1 % Apply 1 application topically 4 (four) times daily. Short term 4x daily.  If it works can use twice daily for maintenance. Not for face or folds. 80 g 3   fluorouracil (EFUDEX) 5 % cream Apply topically 2 (two) times daily. (Patient not taking: Reported on 10/17/2020) 40 g 0   OVER THE COUNTER MEDICATION Zinc 100 mg daily. Patient may increase  to 300 mg as tolerated. (Patient not taking: Reported on 10/17/2020)     Current Facility-Administered Medications  Medication Dose Route Frequency Provider Last Rate Last Admin   0.9 %  sodium chloride infusion  500 mL Intravenous Once Gatha Mayer, MD        Family History  Problem Relation Age of Onset   Breast cancer Mother 35   Breast cancer Maternal Aunt 70   Colon cancer Neg Hx    Esophageal cancer Neg Hx    Liver cancer Neg Hx    Pancreatic cancer Neg Hx    Rectal cancer Neg Hx    Stomach cancer Neg Hx     Review of Systems  Genitourinary:        Vaginal irritation    Exam:   BP 122/76   Pulse 70   Ht 5\' 5"  (1.651 m)   Wt 180 lb (81.6 kg)   LMP 03/31/2011   SpO2 97%   BMI 29.95 kg/m   Weight change: @WEIGHTCHANGE @ Height:   Height: 5\' 5"  (165.1 cm)  Ht Readings from Last 3 Encounters:  10/17/20 5\' 5"  (1.651 m)   09/25/19 5\' 5"  (1.651 m)  05/21/18 5\' 5"  (1.651 m)    General appearance: alert, cooperative and appears stated age Head: Normocephalic, without obvious abnormality, atraumatic Neck: no adenopathy, supple, symmetrical, trachea midline and thyroid normal to inspection and palpation and enlarged Lungs: clear to auscultation bilaterally Cardiovascular: regular rate and rhythm Breasts: normal appearance, no masses or tenderness Abdomen: soft, non-tender; non distended,  no masses,  no organomegaly Extremities: extremities normal, atraumatic, no cyanosis or edema Skin: Skin color, texture, turgor normal. No rashes or lesions Lymph nodes: Cervical, supraclavicular, and axillary nodes normal. No abnormal inguinal nodes palpated Neurologic: Grossly normal   Pelvic: External genitalia:  no lesions              Urethra:  normal appearing urethra with no masses, tenderness or lesions              Bartholins and Skenes: normal                 Vagina: atrophic appearing vagina with normal color and discharge, no lesions              Cervix: no lesions               Bimanual Exam:  Uterus:   no masses or tenderness              Adnexa: no mass, fullness, tenderness               Rectovaginal: Confirms               Anus:  normal sphincter tone, no lesions  Caryn Bee chaperoned for the exam.  1. Well woman exam No pap this year Colonoscopy scheduled Mammogram in 9/22 Discussed breast self exam  2. History of osteopenia Discussed calcium and vit D intake Next DEXA in 9/26  3. Acute vaginitis Mild symptoms, improved some with OTC medication - WET PREP FOR TRICH, YEAST, CLUE: negative - Candida albicans, DNA

## 2020-10-13 ENCOUNTER — Other Ambulatory Visit: Payer: Self-pay

## 2020-10-14 NOTE — Telephone Encounter (Signed)
I called them today after 4 and the message said all calls are turned over to the answering service after 4 due to high call volume. We do not have an insulin pump answer as of yet. Hopefully it will come Monday or Tuesday prior to her appointment.

## 2020-10-17 ENCOUNTER — Other Ambulatory Visit: Payer: Self-pay

## 2020-10-17 ENCOUNTER — Ambulatory Visit (INDEPENDENT_AMBULATORY_CARE_PROVIDER_SITE_OTHER): Payer: BC Managed Care – PPO | Admitting: Obstetrics and Gynecology

## 2020-10-17 ENCOUNTER — Encounter: Payer: Self-pay | Admitting: Obstetrics and Gynecology

## 2020-10-17 VITALS — BP 122/76 | HR 70 | Ht 65.0 in | Wt 180.0 lb

## 2020-10-17 DIAGNOSIS — Z01419 Encounter for gynecological examination (general) (routine) without abnormal findings: Secondary | ICD-10-CM

## 2020-10-17 DIAGNOSIS — Z8739 Personal history of other diseases of the musculoskeletal system and connective tissue: Secondary | ICD-10-CM | POA: Diagnosis not present

## 2020-10-17 DIAGNOSIS — N760 Acute vaginitis: Secondary | ICD-10-CM | POA: Diagnosis not present

## 2020-10-17 DIAGNOSIS — E669 Obesity, unspecified: Secondary | ICD-10-CM | POA: Insufficient documentation

## 2020-10-17 LAB — WET PREP FOR TRICH, YEAST, CLUE

## 2020-10-17 NOTE — Telephone Encounter (Signed)
Rec'd fax from Dr. Loren Racer office with the following reply: "Patient well versed in self management with a Continuous Glucose Monitor and automated pump. Patient has instructions".

## 2020-10-17 NOTE — Telephone Encounter (Signed)
Rec'd call from [facility] at Dr. Loren Racer office. She asked that the request be resent as they don't see it.  Letter dated 6-13 refaxed to [phone] Attn: Estill Bamberg.

## 2020-10-17 NOTE — Patient Instructions (Signed)

## 2020-10-17 NOTE — Telephone Encounter (Signed)
Called and left detailed message for Estill Bamberg, Dr. Loren Racer nurse, requesting request for insulin pump instructions by Previsit scheduled for tomorrow.

## 2020-10-18 ENCOUNTER — Ambulatory Visit (AMBULATORY_SURGERY_CENTER): Payer: BC Managed Care – PPO

## 2020-10-18 ENCOUNTER — Other Ambulatory Visit: Payer: Self-pay

## 2020-10-18 VITALS — Ht 65.0 in | Wt 180.0 lb

## 2020-10-18 DIAGNOSIS — Z86010 Personal history of colonic polyps: Secondary | ICD-10-CM

## 2020-10-18 NOTE — Progress Notes (Signed)
No allergies to soy or egg Pt is not on blood thinners or diet pills Denies issues with sedation/intubation Denies atrial flutter/fib Denies constipation    Pt is aware of Covid safety and care partner requirements.   Pt was told by Dr. Osborne Casco  the insulin instructions for her pump for colonoscopy procedure.  Pt instructed to stop fibercon x 5 day before procedure.

## 2020-10-21 LAB — C ALBICANS, DNA: C. albicans, DNA: NOT DETECTED

## 2020-10-26 ENCOUNTER — Encounter: Payer: Self-pay | Admitting: Internal Medicine

## 2020-11-01 ENCOUNTER — Other Ambulatory Visit: Payer: Self-pay

## 2020-11-01 ENCOUNTER — Ambulatory Visit (AMBULATORY_SURGERY_CENTER): Payer: BC Managed Care – PPO | Admitting: Internal Medicine

## 2020-11-01 ENCOUNTER — Encounter: Payer: Self-pay | Admitting: Internal Medicine

## 2020-11-01 VITALS — BP 146/74 | HR 58 | Temp 97.3°F | Resp 20 | Ht 65.0 in | Wt 180.0 lb

## 2020-11-01 DIAGNOSIS — Z86010 Personal history of colonic polyps: Secondary | ICD-10-CM

## 2020-11-01 MED ORDER — SODIUM CHLORIDE 0.9 % IV SOLN: 500.0000 mL | Freq: Once | INTRAVENOUS | Status: DC

## 2020-11-01 NOTE — Op Note (Signed)
[facility] Patient Name: Julia Nash Procedure Date: 11/01/2020 8:31 AM MRN: [id number] Endoscopist: Gatha Mayer , MD Age: 65 Referring MD:  Date of Birth: January 13, 1956 Gender: Female Account #: [id number] Procedure:                Colonoscopy Indications:              High risk colon cancer surveillance: Personal                            history of colonic polyps, Last colonoscopy: 2019 Medicines:                Propofol per Anesthesia, Monitored Anesthesia Care Procedure:                Pre-Anesthesia Assessment:                           - Prior to the procedure, a History and Physical                            was performed, and patient medications and                            allergies were reviewed. The patient's tolerance of                            previous anesthesia was also reviewed. The risks                            and benefits of the procedure and the sedation                            options and risks were discussed with the patient.                            All questions were answered, and informed consent                            was obtained. Prior Anticoagulants: The patient has                            taken no previous anticoagulant or antiplatelet                            agents. ASA Grade Assessment: II - A patient with                            mild systemic disease. After reviewing the risks                            and benefits, the patient was deemed in                            satisfactory condition to undergo the procedure.  After obtaining informed consent, the colonoscope                            was passed under direct vision. Throughout the                            procedure, the patient's blood pressure, pulse, and                            oxygen saturations were monitored continuously. The                            Olympus CF-HQ190L [id number]) Colonoscope was                             introduced through the anus and advanced to the the                            cecum, identified by appendiceal orifice and                            ileocecal valve. The quality of the bowel                            preparation was good. The colonoscopy was performed                            without difficulty. The patient tolerated the                            procedure well. The bowel preparation used was                            Miralax via split dose instruction. Scope In: 8:47:36 AM Scope Out: 9:01:13 AM Scope Withdrawal Time: 0 hours 10 minutes 29 seconds  Total Procedure Duration: 0 hours 13 minutes 37 seconds  Findings:                 The perianal and digital rectal examinations were                            normal.                           The colon (entire examined portion) appeared normal.                           No additional abnormalities were found on                            retroflexion. Complications:            No immediate complications. Estimated blood loss:                            None. Estimated Blood Loss:  Estimated blood loss: none. Impression:               - The entire examined colon is normal.                           - No specimens collected.                           - Personal history of colonic polyps. 2013 small                            adenoma, 2019 4 and 10 mm adenomas Recommendation:           - Repeat colonoscopy in 5 years for surveillance.                           - Patient has a contact number available for                            emergencies. The signs and symptoms of potential                            delayed complications were discussed with the                            patient. Return to normal activities tomorrow.                            Written discharge instructions were provided to the                            patient.                           - Resume previous diet.                           -  Continue present medications. Gatha Mayer, MD 11/01/2020 9:08:33 AM This report has been signed electronically.

## 2020-11-01 NOTE — Progress Notes (Signed)
Pt's states no medical or surgical changes since previsit or office visit. 

## 2020-11-01 NOTE — Progress Notes (Signed)
PT taken to PACU. Monitors in place. VSS. Report given to RN. 

## 2020-11-01 NOTE — Patient Instructions (Addendum)
No polyps today - all looks ok.  Your next routine colonoscopy should be in 5 years - 2027.   Note: you should not do routine stool tests if offered - you are getting regular colonoscopy due to history of polyps  I appreciate the opportunity to care for you. Gatha Mayer, MD, FACG  YOU HAD AN ENDOSCOPIC PROCEDURE TODAY AT [facility]:   Refer to the procedure report that was given to you for any specific questions about what was found during the examination.  If the procedure report does not answer your questions, please call your gastroenterologist to clarify.  If you requested that your care partner not be given the details of your procedure findings, then the procedure report has been included in a sealed envelope for you to review at your convenience later.  YOU SHOULD EXPECT: Some feelings of bloating in the abdomen. Passage of more gas than usual.  Walking can help get rid of the air that was put into your GI tract during the procedure and reduce the bloating. If you had a lower endoscopy (such as a colonoscopy or flexible sigmoidoscopy) you may notice spotting of blood in your stool or on the toilet paper. If you underwent a bowel prep for your procedure, you may not have a normal bowel movement for a few days.  Please Note:  You might notice some irritation and congestion in your nose or some drainage.  This is from the oxygen used during your procedure.  There is no need for concern and it should clear up in a day or so.  SYMPTOMS TO REPORT IMMEDIATELY:  Following lower endoscopy (colonoscopy or flexible sigmoidoscopy):  Excessive amounts of blood in the stool  Significant tenderness or worsening of abdominal pains  Swelling of the abdomen that is new, acute  Fever of 100F or higher  For urgent or emergent issues, a gastroenterologist can be reached at any hour by calling [phone]. Do not use MyChart messaging for urgent concerns.    DIET:  We do  recommend a small meal at first, but then you may proceed to your regular diet.  Drink plenty of fluids but you should avoid alcoholic beverages for 24 hours.  ACTIVITY:  You should plan to take it easy for the rest of today and you should NOT DRIVE or use heavy machinery until tomorrow (because of the sedation medicines used during the test).    FOLLOW UP: Our staff will call the number listed on your records 48-72 hours following your procedure to check on you and address any questions or concerns that you may have regarding the information given to you following your procedure. If we do not reach you, we will leave a message.  We will attempt to reach you two times.  During this call, we will ask if you have developed any symptoms of COVID 19. If you develop any symptoms (ie: fever, flu-like symptoms, shortness of breath, cough etc.) before then, please call [phone].  If you test positive for Covid 19 in the 2 weeks post procedure, please call and report this information to [place].    If any biopsies were taken you will be contacted by phone or by letter within the next 1-3 weeks.  Please call us at [phone] if you have not heard about the biopsies in 3 weeks.    SIGNATURES/CONFIDENTIALITY: You and/or your care partner have signed paperwork which will be entered into your electronic medical record.  These  signatures attest to the fact that that the information above on your After Visit Summary has been reviewed and is understood.  Full responsibility of the confidentiality of this discharge information lies with you and/or your care-partner.

## 2020-11-03 ENCOUNTER — Telehealth: Payer: Self-pay

## 2020-11-03 NOTE — Telephone Encounter (Signed)
  Follow up Call-  Call back number 11/01/2020  Post procedure Call Back phone  # [phone]  Permission to leave phone message Yes  Some recent data might be hidden     Patient questions:  Do you have a fever, pain , or abdominal swelling? No. Pain Score  0 *  Have you tolerated food without any problems? Yes.    Have you been able to return to your normal activities? Yes.    Do you have any questions about your discharge instructions: Diet   No. Medications  No. Follow up visit  No.  Do you have questions or concerns about your Care? No.  Actions: * If pain score is 4 or above: No action needed, pain <4.

## 2020-11-29 ENCOUNTER — Other Ambulatory Visit: Payer: Self-pay | Admitting: Obstetrics and Gynecology

## 2020-11-29 DIAGNOSIS — Z1231 Encounter for screening mammogram for malignant neoplasm of breast: Secondary | ICD-10-CM

## 2020-12-28 ENCOUNTER — Other Ambulatory Visit: Payer: Self-pay

## 2020-12-28 ENCOUNTER — Ambulatory Visit: Payer: BC Managed Care – PPO | Admitting: Physician Assistant

## 2020-12-28 ENCOUNTER — Encounter: Payer: Self-pay | Admitting: Physician Assistant

## 2020-12-28 DIAGNOSIS — Z85828 Personal history of other malignant neoplasm of skin: Secondary | ICD-10-CM

## 2020-12-28 DIAGNOSIS — Z85820 Personal history of malignant melanoma of skin: Secondary | ICD-10-CM | POA: Diagnosis not present

## 2020-12-28 DIAGNOSIS — Z1283 Encounter for screening for malignant neoplasm of skin: Secondary | ICD-10-CM | POA: Diagnosis not present

## 2020-12-28 DIAGNOSIS — L570 Actinic keratosis: Secondary | ICD-10-CM

## 2020-12-29 NOTE — Progress Notes (Signed)
   Follow-Up Visit   Subjective  Julia Nash is a 65 y.o. female who presents for the following: Annual Exam (Here for annual skin exam. No concerns. Ozzie Hoyle of non mole skin cancers. History of melanoma on chest. ).   The following portions of the chart were reviewed this encounter and updated as appropriate:  Tobacco  Allergies  Meds  Problems  Med Hx  Surg Hx  Fam Hx      Objective  Well appearing patient in no apparent distress; mood and affect are within normal limits.  A full examination was performed including scalp, head, eyes, ears, nose, lips, neck, chest, axillae, abdomen, back, buttocks, bilateral upper extremities, bilateral lower extremities, hands, feet, fingers, toes, fingernails, and toenails. All findings within normal limits unless otherwise noted below.  Left Upper Back, Right Upper Back Erythematous patches with gritty scale.  Head to toe No atypical nevi No signs of non-mole skin cancer.     Assessment & Plan  AK (actinic keratosis) (2) Left Upper Back; Right Upper Back  Destruction of lesion - Left Upper Back, Right Upper Back Complexity: simple   Destruction method: cryotherapy   Informed consent: discussed and consent obtained   Timeout:  patient name, date of birth, surgical site, and procedure verified Lesion destroyed using liquid nitrogen: Yes   Cryotherapy cycles:  1 Outcome: patient tolerated procedure well with no complications   Post-procedure details: wound care instructions given    Screening for malignant neoplasm of skin Head to toe  Biannual skin examinations    I, Shaine Newmark, PA-C, have reviewed all documentation's for this visit.  The documentation on 12/29/20 for the exam, diagnosis, procedures and orders are all accurate and complete.

## 2021-01-18 ENCOUNTER — Other Ambulatory Visit: Payer: Self-pay

## 2021-01-18 ENCOUNTER — Ambulatory Visit
Admission: RE | Admit: 2021-01-18 | Discharge: 2021-01-18 | Disposition: A | Discharge status: Home / Self Care | Payer: BC Managed Care – PPO | Source: Ambulatory Visit | Attending: Obstetrics and Gynecology | Admitting: Obstetrics and Gynecology

## 2021-01-18 DIAGNOSIS — Z1231 Encounter for screening mammogram for malignant neoplasm of breast: Secondary | ICD-10-CM

## 2021-04-10 ENCOUNTER — Encounter: Payer: Self-pay | Admitting: Podiatry

## 2021-04-10 ENCOUNTER — Other Ambulatory Visit: Payer: Self-pay

## 2021-04-10 ENCOUNTER — Ambulatory Visit: Payer: BC Managed Care – PPO | Admitting: Podiatry

## 2021-04-10 DIAGNOSIS — E109 Type 1 diabetes mellitus without complications: Secondary | ICD-10-CM

## 2021-04-10 NOTE — Progress Notes (Signed)
  Subjective:  Patient ID: Julia Nash, female    DOB: October 25, 1955,   MRN: [id number]  Chief Complaint  Patient presents with   Nail Problem     Diabetic foot care     65 y.o. female presents for diabetic foot check. Relates she has a history of foot issues but doing well today. Does relates pain and discomfort with burning and tingling on the top of her feet at night. No tenderness today. Does have a history of plantar fasciitis, plantar wars/melanoma.  . Denies any other pedal complaints. Denies n/v/f/c.   Past Medical History:  Diagnosis Date   Allergy    Anemia    Anxiety    Arthritis    BCC (basal cell carcinoma of skin) 10/28/2018   SUPERFICIAL- LEFT TIP OF SHOULDER- TX CURET AFTER BIOPSY   Diabetes mellitus type 1 ([place])    age 43   Fibroid    Hx of adenomatous polyp of colon 03/14/2012   02/2012 - small adenoma([place]) 07/12/2017 4 and 10 mm polyps    Hyperlipidemia    Hypertension    Melanoma ([place]) 06/21/2017   IN SITU- MID CHEST- TX MOHS   SCCA (squamous cell carcinoma) of skin 02/26/2018   UPPER FOREHEAD MID- TX CURET X 3, 5FU   SCCA (squamous cell carcinoma) of skin 10/28/2018   WELL DIFF- RIGHT FOREHEAD- TX CURET AFTER BIOPSY   Shingles     Objective:  Physical Exam: Vascular: DP/PT pulses 2/4 bilateral. CFT <3 seconds. Normal hair growth on digits. No edema.  Skin. No lacerations or abrasions bilateral feet. Nails 1-5 b/l normal in appearance no maceration in webspaces.  Musculoskeletal: MMT 5/5 bilateral lower extremities in DF, PF, Inversion and Eversion. Deceased ROM in DF of ankle joint.  Neurological: Sensation intact to light touch.   Assessment:   1. Type 1 diabetes mellitus without complication ([place])      Plan:  Patient was evaluated and treated and all questions answered. -Discussed and educated patient on diabetic foot care, especially with  regards to the vascular, neurological and musculoskeletal systems.  -Stressed the importance of  good glycemic control and the detriment of not  controlling glucose levels in relation to the foot. -Discussed supportive shoes at all times and checking feet regularly.  -Answered all patient questions -Patient to return  in 1 year for diabetic foot check -Patient advised to call the office if any problems or questions arise in the meantime.   Lorenda Peck, DPM

## 2021-04-27 DIAGNOSIS — R0609 Other forms of dyspnea: Secondary | ICD-10-CM | POA: Insufficient documentation

## 2021-04-27 DIAGNOSIS — I2510 Atherosclerotic heart disease of native coronary artery without angina pectoris: Secondary | ICD-10-CM | POA: Insufficient documentation

## 2021-06-28 ENCOUNTER — Ambulatory Visit: Payer: BC Managed Care – PPO | Admitting: Physician Assistant

## 2021-09-28 ENCOUNTER — Encounter: Payer: Self-pay | Admitting: Physician Assistant

## 2021-09-28 ENCOUNTER — Ambulatory Visit: Payer: BC Managed Care – PPO | Admitting: Physician Assistant

## 2021-09-28 DIAGNOSIS — L570 Actinic keratosis: Secondary | ICD-10-CM | POA: Diagnosis not present

## 2021-09-28 DIAGNOSIS — D0439 Carcinoma in situ of skin of other parts of face: Secondary | ICD-10-CM

## 2021-09-28 DIAGNOSIS — C44519 Basal cell carcinoma of skin of other part of trunk: Secondary | ICD-10-CM | POA: Diagnosis not present

## 2021-09-28 DIAGNOSIS — Z85820 Personal history of malignant melanoma of skin: Secondary | ICD-10-CM

## 2021-09-28 DIAGNOSIS — D0430 Carcinoma in situ of skin of unspecified part of face: Secondary | ICD-10-CM

## 2021-09-28 DIAGNOSIS — D485 Neoplasm of uncertain behavior of skin: Secondary | ICD-10-CM

## 2021-09-28 NOTE — Patient Instructions (Signed)

## 2021-09-28 NOTE — Progress Notes (Signed)
Follow-Up Visit   Subjective  Julia Nash is a 66 y.o. female who presents for the following: Follow-up (6 month follow up Patient wants her forehead checked. History of melanoma and non mole skin cancers.).   The following portions of the chart were reviewed this encounter and updated as appropriate:  Tobacco  Allergies  Meds  Problems  Med Hx  Surg Hx  Fam Hx      Objective  Well appearing patient in no apparent distress; mood and affect are within normal limits.  A full examination was performed including scalp, head, eyes, ears, nose, lips, neck, chest, axillae, abdomen, back, buttocks, bilateral upper extremities, bilateral lower extremities, hands, feet, fingers, toes, fingernails, and toenails. All findings within normal limits unless otherwise noted below.  Mid Chest Dyspigmented scar clear.  Left Buccal Cheek (3), Mid Forehead Erythematous patches with gritty scale.  Left Upper Back Scaly erythematous macule        Mid Forehead Scaly erythematous macule        Right Submandibular Area Hyperkeratotic scale with pink base       Assessment & Plan  Personal history of malignant melanoma of skin Mid Chest  Biannual skin examination   AK (actinic keratosis) (4) Mid Forehead; Left Buccal Cheek (3)  Destruction of lesion - Left Buccal Cheek, Mid Forehead Complexity: simple   Destruction method: cryotherapy   Informed consent: discussed and consent obtained   Timeout:  patient name, date of birth, surgical site, and procedure verified Lesion destroyed using liquid nitrogen: Yes   Cryotherapy cycles:  3 Outcome: patient tolerated procedure well with no complications    BCC (basal cell carcinoma), back Left Upper Back  Skin / nail biopsy Type of biopsy: tangential   Informed consent: discussed and consent obtained   Timeout: patient name, date of birth, surgical site, and procedure verified   Procedure prep:  Patient was prepped  and draped in usual sterile fashion (Non sterile) Prep type:  Chlorhexidine Anesthesia: the lesion was anesthetized in a standard fashion   Anesthetic:  1% lidocaine w/ epinephrine 1-100,000 local infiltration Instrument used: flexible razor blade   Outcome: patient tolerated procedure well   Post-procedure details: wound care instructions given    Destruction of lesion Complexity: simple   Destruction method: electrodesiccation and curettage   Informed consent: discussed and consent obtained   Timeout:  patient name, date of birth, surgical site, and procedure verified Anesthesia: the lesion was anesthetized in a standard fashion   Anesthetic:  1% lidocaine w/ epinephrine 1-100,000 local infiltration Curettage performed in three different directions: Yes   Electrodesiccation performed over the curetted area: Yes   Curettage cycles:  3 Margin per side (cm):  0.1 Final wound size (cm):  1.4 Hemostasis achieved with:  aluminum chloride Outcome: patient tolerated procedure well with no complications   Post-procedure details: wound care instructions given    Specimen 3 - Surgical pathology Differential Diagnosis: bcc vs [id number]-[id number]  Check Margins: yes  Carcinoma in situ of skin of face, unspecified location (2) Mid Forehead  Skin / nail biopsy Type of biopsy: tangential   Informed consent: discussed and consent obtained   Timeout: patient name, date of birth, surgical site, and procedure verified   Procedure prep:  Patient was prepped and draped in usual sterile fashion (Non sterile) Prep type:  Chlorhexidine Anesthesia: the lesion was anesthetized in a standard fashion   Anesthetic:  1% lidocaine w/ epinephrine 1-100,000 local infiltration Instrument used: flexible razor blade  Outcome: patient tolerated procedure well   Post-procedure details: wound care instructions given    Destruction of lesion Complexity: simple   Destruction method: electrodesiccation and curettage    Informed consent: discussed and consent obtained   Timeout:  patient name, date of birth, surgical site, and procedure verified Anesthesia: the lesion was anesthetized in a standard fashion   Anesthetic:  1% lidocaine w/ epinephrine 1-100,000 local infiltration Curettage performed in three different directions: Yes   Electrodesiccation performed over the curetted area: Yes   Curettage cycles:  3 Margin per side (cm):  0.1 Final wound size (cm):  1.5 Hemostasis achieved with:  aluminum chloride Outcome: patient tolerated procedure well with no complications   Post-procedure details: wound care instructions given    Specimen 1 - Surgical pathology Differential Diagnosis: bcc vs [id number]-[id number]  Check Margins: yes  Right Submandibular Area  Skin / nail biopsy Type of biopsy: tangential   Informed consent: discussed and consent obtained   Timeout: patient name, date of birth, surgical site, and procedure verified   Procedure prep:  Patient was prepped and draped in usual sterile fashion (Non sterile) Prep type:  Chlorhexidine Anesthesia: the lesion was anesthetized in a standard fashion   Anesthetic:  1% lidocaine w/ epinephrine 1-100,000 local infiltration Instrument used: flexible razor blade   Outcome: patient tolerated procedure well   Post-procedure details: wound care instructions given    Destruction of lesion Complexity: simple   Destruction method: electrodesiccation and curettage   Informed consent: discussed and consent obtained   Timeout:  patient name, date of birth, surgical site, and procedure verified Anesthesia: the lesion was anesthetized in a standard fashion   Anesthetic:  1% lidocaine w/ epinephrine 1-100,000 local infiltration Curettage performed in three different directions: Yes   Electrodesiccation performed over the curetted area: Yes   Curettage cycles:  3 Margin per side (cm):  0.1 Final wound size (cm):  1.5 Hemostasis achieved with:  aluminum  chloride Outcome: patient tolerated procedure well with no complications   Post-procedure details: wound care instructions given    Specimen 2 - Surgical pathology Differential Diagnosis: bcc vs scc-txpbx  Check Margins: yes   No atypical nevi noted at the time of the visit.  I, Saumya Hukill, PA-C, have reviewed all documentation's for this visit.  The documentation on 10/03/21 for the exam, diagnosis, procedures and orders are all accurate and complete.

## 2021-10-06 NOTE — Progress Notes (Deleted)
66 y.o. G0P0000 Married White or Caucasian Not Hispanic or Latino female here for annual exam.      Patient's last menstrual period was 03/31/2011.          Sexually active: {yes no:314532}  The current method of family planning is {contraception:315051}.    Exercising: {yes no:314532}  {types:19826} Smoker:  {YES [id number]  Health Maintenance: Pap:  04-25-17 normal Neg HPV History of abnormal Pap:  no MMG:  01/18/21 Bi-rads 1 neg  BMD:   12-30-19 Mild Osteopenia,  T score -1.2, FRAX 9.8/0.8%. Repeat in 5 years Colonoscopy: 11/01/20 normal personal hx of polyps f/u 5 years  TDaP:  09/30/20  Gardasil: n/a   reports that she has never smoked. She has never used smokeless tobacco. She reports current alcohol use of about 7.0 standard drinks of alcohol per week. She reports that she does not use drugs.  Past Medical History:  Diagnosis Date   Allergy    Anemia    Anxiety    Arthritis    BCC (basal cell carcinoma of skin) 10/28/2018   SUPERFICIAL- LEFT TIP OF SHOULDER- TX CURET AFTER BIOPSY   Diabetes mellitus type 1 ([place])    age 61   Fibroid    Hx of adenomatous polyp of colon 03/14/2012   02/2012 - small adenoma([place]) 07/12/2017 4 and 10 mm polyps    Hyperlipidemia    Hypertension    Melanoma ([place]) 06/21/2017   IN SITU- MID CHEST- TX MOHS   SCCA (squamous cell carcinoma) of skin 02/26/2018   UPPER FOREHEAD MID- TX CURET X 3, 5FU   SCCA (squamous cell carcinoma) of skin 10/28/2018   WELL DIFF- RIGHT FOREHEAD- TX CURET AFTER BIOPSY   Shingles     Past Surgical History:  Procedure Laterality Date   BREAST EXCISIONAL BIOPSY Right    COLONOSCOPY     DILATION AND CURETTAGE OF UTERUS     FOOT SURGERY Left age 48   right breast bx     benign mass 07-02-17   TONSILLECTOMY AND ADENOIDECTOMY      Current Outpatient Medications  Medication Sig Dispense Refill   aspirin 81 MG tablet Take 81 mg by mouth daily.     carvedilol (COREG) 6.25 MG tablet Take 6.25 mg by mouth 2 (two) times  daily with a meal.     celecoxib (CELEBREX) 200 MG capsule Take 200 mg by mouth daily.     losartan-hydrochlorothiazide (HYZAAR) 50-12.5 MG tablet Take 1 tablet by mouth daily.     Multiple Vitamin (MULTI-VITAMIN PO) Take by mouth.     NOVOLOG 100 UNIT/ML injection Insulin pump     polycarbophil (FIBERCON) 625 MG tablet 2 tablets as needed     rosuvastatin (CRESTOR) 40 MG tablet Take 40 mg by mouth daily.     simvastatin (ZOCOR) 40 MG tablet Take 40 mg by mouth daily.     No current facility-administered medications for this visit.    Family History  Problem Relation Age of Onset   Breast cancer Mother 79   Colon polyps Father    Breast cancer Maternal Aunt 70   Colon cancer Neg Hx    Esophageal cancer Neg Hx    Liver cancer Neg Hx    Pancreatic cancer Neg Hx    Rectal cancer Neg Hx    Stomach cancer Neg Hx     Review of Systems  Exam:   LMP 03/31/2011   Weight change: '@WEIGHTCHANGE'$ @ Height:      Ht Readings  from Last 3 Encounters:  11/01/20 '5\' 5"'$  (1.651 m)  10/18/20 '5\' 5"'$  (1.651 m)  10/17/20 '5\' 5"'$  (1.651 m)    General appearance: alert, cooperative and appears stated age Head: Normocephalic, without obvious abnormality, atraumatic Neck: no adenopathy, supple, symmetrical, trachea midline and thyroid {CHL AMB PHY EX THYROID NORM DEFAULT:[phone]::"normal to inspection and palpation"} Lungs: clear to auscultation bilaterally Cardiovascular: regular rate and rhythm Breasts: {Exam; breast:13139::"normal appearance, no masses or tenderness"} Abdomen: soft, non-tender; non distended,  no masses,  no organomegaly Extremities: extremities normal, atraumatic, no cyanosis or edema Skin: Skin color, texture, turgor normal. No rashes or lesions Lymph nodes: Cervical, supraclavicular, and axillary nodes normal. No abnormal inguinal nodes palpated Neurologic: Grossly normal   Pelvic: External genitalia:  no lesions              Urethra:  normal appearing urethra with no masses,  tenderness or lesions              Bartholins and Skenes: normal                 Vagina: normal appearing vagina with normal color and discharge, no lesions              Cervix: {CHL AMB PHY EX CERVIX NORM DEFAULT:[phone]::"no lesions"}               Bimanual Exam:  Uterus:  {CHL AMB PHY EX UTERUS NORM [phone]::"normal size, contour, position, consistency, mobility, non-tender"}              Adnexa: {CHL AMB PHY EX ADNEXA NO MASS DEFAULT:[phone]::"no mass, fullness, tenderness"}               Rectovaginal: Confirms               Anus:  normal sphincter tone, no lesions  *** chaperoned for the exam.  A:  Well Woman with normal exam  P:

## 2021-10-18 ENCOUNTER — Ambulatory Visit: Payer: BC Managed Care – PPO | Admitting: Obstetrics and Gynecology

## 2021-10-29 ENCOUNTER — Encounter: Payer: Self-pay | Admitting: Physician Assistant

## 2021-11-14 NOTE — Progress Notes (Deleted)
66 y.o. G0P0000 Married White or Caucasian Not Hispanic or Latino female here for annual exam.      Patient's last menstrual period was 03/31/2011.          Sexually active: {yes no:314532}  The current method of family planning is {contraception:315051}.    Exercising: {yes no:314532}  {types:19826} Smoker:  {YES [id number]  Health Maintenance: Pap:   04-25-17 normal Neg HPV History of abnormal Pap:  no MMG:  01/24/21 density C Bi-rads 1 neg  BMD:    12-30-19 Mild Osteopenia,  T score -1.2, FRAX 9.8/0.8%. Repeat in 5 years Colonoscopy: 11/01/20 normal f/u 5 years hx adenomas  TDaP:  2022  Gardasil: n/a   reports that she has never smoked. She has never used smokeless tobacco. She reports current alcohol use of about 7.0 standard drinks of alcohol per week. She reports that she does not use drugs.  Past Medical History:  Diagnosis Date   Allergy    Anemia    Anxiety    Arthritis    BCC (basal cell carcinoma of skin) 10/28/2018   SUPERFICIAL- LEFT TIP OF SHOULDER- TX CURET AFTER BIOPSY   Diabetes mellitus type 1 ([place])    age 63   Fibroid    Hx of adenomatous polyp of colon 03/14/2012   02/2012 - small adenoma([place]) 07/12/2017 4 and 10 mm polyps    Hyperlipidemia    Hypertension    Melanoma ([place]) 06/21/2017   IN SITU- MID CHEST- TX MOHS   SCCA (squamous cell carcinoma) of skin 02/26/2018   UPPER FOREHEAD MID- TX CURET X 3, 5FU   SCCA (squamous cell carcinoma) of skin 10/28/2018   WELL DIFF- RIGHT FOREHEAD- TX CURET AFTER BIOPSY   Shingles     Past Surgical History:  Procedure Laterality Date   BREAST EXCISIONAL BIOPSY Right    COLONOSCOPY     DILATION AND CURETTAGE OF UTERUS     FOOT SURGERY Left age 17   right breast bx     benign mass 07-02-17   TONSILLECTOMY AND ADENOIDECTOMY      Current Outpatient Medications  Medication Sig Dispense Refill   aspirin 81 MG tablet Take 81 mg by mouth daily.     carvedilol (COREG) 6.25 MG tablet Take 6.25 mg by mouth 2 (two) times  daily with a meal.     celecoxib (CELEBREX) 200 MG capsule Take 200 mg by mouth daily.     losartan-hydrochlorothiazide (HYZAAR) 50-12.5 MG tablet Take 1 tablet by mouth daily.     Multiple Vitamin (MULTI-VITAMIN PO) Take by mouth.     NOVOLOG 100 UNIT/ML injection Insulin pump     polycarbophil (FIBERCON) 625 MG tablet 2 tablets as needed     rosuvastatin (CRESTOR) 40 MG tablet Take 40 mg by mouth daily.     simvastatin (ZOCOR) 40 MG tablet Take 40 mg by mouth daily.     No current facility-administered medications for this visit.    Family History  Problem Relation Age of Onset   Breast cancer Mother 3   Colon polyps Father    Breast cancer Maternal Aunt 70   Colon cancer Neg Hx    Esophageal cancer Neg Hx    Liver cancer Neg Hx    Pancreatic cancer Neg Hx    Rectal cancer Neg Hx    Stomach cancer Neg Hx     Review of Systems  Exam:   LMP 03/31/2011   Weight change: '@WEIGHTCHANGE'$ @ Height:  Ht Readings from Last 3 Encounters:  11/01/20 '5\' 5"'$  (1.651 m)  10/18/20 '5\' 5"'$  (1.651 m)  10/17/20 '5\' 5"'$  (1.651 m)    General appearance: alert, cooperative and appears stated age Head: Normocephalic, without obvious abnormality, atraumatic Neck: no adenopathy, supple, symmetrical, trachea midline and thyroid {CHL AMB PHY EX THYROID NORM DEFAULT:[phone]::"normal to inspection and palpation"} Lungs: clear to auscultation bilaterally Cardiovascular: regular rate and rhythm Breasts: {Exam; breast:13139::"normal appearance, no masses or tenderness"} Abdomen: soft, non-tender; non distended,  no masses,  no organomegaly Extremities: extremities normal, atraumatic, no cyanosis or edema Skin: Skin color, texture, turgor normal. No rashes or lesions Lymph nodes: Cervical, supraclavicular, and axillary nodes normal. No abnormal inguinal nodes palpated Neurologic: Grossly normal   Pelvic: External genitalia:  no lesions              Urethra:  normal appearing urethra with no masses,  tenderness or lesions              Bartholins and Skenes: normal                 Vagina: normal appearing vagina with normal color and discharge, no lesions              Cervix: {CHL AMB PHY EX CERVIX NORM DEFAULT:[phone]::"no lesions"}               Bimanual Exam:  Uterus:  {CHL AMB PHY EX UTERUS NORM [phone]::"normal size, contour, position, consistency, mobility, non-tender"}              Adnexa: {CHL AMB PHY EX ADNEXA NO MASS [phone]::"no mass, fullness, tenderness"}               Rectovaginal: Confirms               Anus:  normal sphincter tone, no lesions  *** chaperoned for the exam.  A:  Well Woman with normal exam  P:

## 2021-11-22 ENCOUNTER — Ambulatory Visit: Payer: BC Managed Care – PPO | Admitting: Obstetrics and Gynecology

## 2021-11-27 ENCOUNTER — Encounter: Payer: Self-pay | Admitting: Physician Assistant

## 2021-11-27 NOTE — Telephone Encounter (Signed)
Julia Nash reached out to patient Julia Nash would be her recommended referral.

## 2021-12-18 NOTE — Progress Notes (Signed)
66 y.o. G0P0000 Married White or Caucasian Not Hispanic or Latino female here for annual exam. No vaginal bleeding.    H/O Type I DM, on insulin pump. Typically HgbA1C is in the 6's.   She is under tremendous stress, her husband is very ill. He is a quadriplegic on a Vent. They don't qualify for medicaid. 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   She started a new antidepressant last month, thinks it's helping some.   Currently having diarrhea, thinks it's from the stress.    H/O melanoma (2019)  Patient's last menstrual period was 03/31/2011.          Sexually active: No.  The current method of family planning is post menopausal status.    Exercising: No.  The patient does not participate in regular exercise at present. Smoker:  no  Health Maintenance: Pap:  04-25-17 normal Neg HPV History of abnormal Pap:  no MMG:  01/18/21 density C Bi-rads 1 neg  BMD:   12/30/19 Osteopenic  Colonoscopy: 11/01/20 normal f/u 5 years  TDaP:  2022 Gardasil: n/a   reports that she has never smoked. She has never used smokeless tobacco. She reports current alcohol use of about 7.0 standard drinks of alcohol per week. She reports that she does not use drugs. She drinks 1-2 drinks a night. Husband is a quadriplegic (since 1), they have been together for 38 years. Retired Warden/ranger, she worked in the [employer].  Past Medical History:  Diagnosis Date   Allergy    Anemia    Anxiety    Arthritis    BCC (basal cell carcinoma of skin) 10/28/2018   SUPERFICIAL- LEFT TIP OF SHOULDER- TX CURET AFTER BIOPSY   Diabetes mellitus type 1 ([place])    age 29   Fibroid    Hx of adenomatous polyp of colon 03/14/2012   02/2012 - small adenoma([place]) 07/12/2017 4 and 10 mm polyps    Hyperlipidemia    Hypertension    Melanoma ([place]) 06/21/2017   IN SITU- MID CHEST- TX MOHS   SCCA (squamous cell carcinoma) of skin 02/26/2018   UPPER FOREHEAD MID- TX CURET X 3, 5FU   SCCA (squamous cell carcinoma) of skin  10/28/2018   WELL DIFF- RIGHT FOREHEAD- TX CURET AFTER BIOPSY   Shingles     Past Surgical History:  Procedure Laterality Date   BREAST EXCISIONAL BIOPSY Right    COLONOSCOPY     DILATION AND CURETTAGE OF UTERUS     FOOT SURGERY Left age 39   right breast bx     benign mass 07-02-17   TONSILLECTOMY AND ADENOIDECTOMY      Current Outpatient Medications  Medication Sig Dispense Refill   aspirin 81 MG tablet Take 81 mg by mouth daily.     carvedilol (COREG) 6.25 MG tablet Take 6.25 mg by mouth 2 (two) times daily with a meal.     celecoxib (CELEBREX) 200 MG capsule Take 200 mg by mouth daily.     losartan-hydrochlorothiazide (HYZAAR) 50-12.5 MG tablet Take 1 tablet by mouth daily.     Multiple Vitamin (MULTI-VITAMIN PO) Take by mouth.     NOVOLOG 100 UNIT/ML injection Insulin pump     polycarbophil (FIBERCON) 625 MG tablet 2 tablets as needed     rosuvastatin (CRESTOR) 40 MG tablet Take 40 mg by mouth daily.     sertraline (ZOLOFT) 50 MG tablet      simvastatin (ZOCOR) 40 MG tablet Take 40 mg by mouth daily.  No current facility-administered medications for this visit.    Family History  Problem Relation Age of Onset   Breast cancer Mother 5   Colon polyps Father    Breast cancer Maternal Aunt 70   Colon cancer Neg Hx    Esophageal cancer Neg Hx    Liver cancer Neg Hx    Pancreatic cancer Neg Hx    Rectal cancer Neg Hx    Stomach cancer Neg Hx     Review of Systems  All other systems reviewed and are negative.   Exam:   BP 126/68   Pulse 73   Ht 5' 5.5" (1.664 m)   Wt 176 lb 9.6 oz (80.1 kg)   LMP 03/31/2011   SpO2 98%   BMI 28.94 kg/m   Weight change: '@WEIGHTCHANGE'$ @ Height:   Height: 5' 5.5" (166.4 cm)  Ht Readings from Last 3 Encounters:  12/25/21 5' 5.5" (1.664 m)  11/01/20 '5\' 5"'$  (1.651 m)  10/18/20 '5\' 5"'$  (1.651 m)    General appearance: alert, cooperative and appears stated age Head: Normocephalic, without obvious abnormality, atraumatic Neck: no  adenopathy, supple, symmetrical, trachea midline and thyroid normal to inspection and palpation Lungs: clear to auscultation bilaterally Cardiovascular: regular rate and rhythm Breasts: normal appearance, no masses or tenderness Abdomen: soft, non-tender; non distended,  no masses,  no organomegaly Extremities: extremities normal, atraumatic, no cyanosis or edema Skin: Skin color, texture, turgor normal. No rashes or lesions Lymph nodes: Cervical, supraclavicular, and axillary nodes normal. No abnormal inguinal nodes palpated Neurologic: Grossly normal   Pelvic: External genitalia:  no lesions              Urethra:  normal appearing urethra with no masses, tenderness or lesions              Bartholins and Skenes: normal                 Vagina: atrophic appearing vagina with normal color and discharge, no lesions              Cervix: no lesions               Bimanual Exam:  Uterus:   no masses or tenderness              Adnexa: no mass, fullness, tenderness               Rectovaginal: Confirms               Anus:  normal sphincter tone, no lesions  Gae Dry chaperoned for the exam.  1. Well woman exam Discussed breast self exam Discussed calcium and vit D intake Mammogram next month Colonoscopy and DEXA are UTD  2. Screening for cervical cancer - Cytology - PAP  3. Stress Husband has been very ill, she is is 24/7 caregiver.

## 2021-12-25 ENCOUNTER — Other Ambulatory Visit (HOSPITAL_COMMUNITY)
Admission: RE | Admit: 2021-12-25 | Discharge: 2021-12-25 | Disposition: A | Discharge status: Home / Self Care | Payer: BC Managed Care – PPO | Source: Ambulatory Visit | Attending: Obstetrics and Gynecology | Admitting: Obstetrics and Gynecology

## 2021-12-25 ENCOUNTER — Ambulatory Visit (INDEPENDENT_AMBULATORY_CARE_PROVIDER_SITE_OTHER): Payer: BC Managed Care – PPO | Admitting: Obstetrics and Gynecology

## 2021-12-25 ENCOUNTER — Encounter: Payer: Self-pay | Admitting: Obstetrics and Gynecology

## 2021-12-25 VITALS — BP 126/68 | HR 73 | Ht 65.5 in | Wt 176.6 lb

## 2021-12-25 DIAGNOSIS — Z01419 Encounter for gynecological examination (general) (routine) without abnormal findings: Secondary | ICD-10-CM

## 2021-12-25 DIAGNOSIS — Z124 Encounter for screening for malignant neoplasm of cervix: Secondary | ICD-10-CM | POA: Diagnosis present

## 2021-12-25 DIAGNOSIS — F439 Reaction to severe stress, unspecified: Secondary | ICD-10-CM

## 2021-12-25 NOTE — Patient Instructions (Signed)

## 2021-12-28 LAB — CYTOLOGY - PAP
Comment: NEGATIVE
Diagnosis: NEGATIVE
High risk HPV: NEGATIVE

## 2022-01-03 ENCOUNTER — Encounter: Payer: Self-pay | Admitting: Obstetrics and Gynecology

## 2022-01-23 ENCOUNTER — Other Ambulatory Visit (HOSPITAL_BASED_OUTPATIENT_CLINIC_OR_DEPARTMENT_OTHER): Payer: Self-pay | Admitting: Obstetrics and Gynecology

## 2022-01-23 DIAGNOSIS — Z1231 Encounter for screening mammogram for malignant neoplasm of breast: Secondary | ICD-10-CM

## 2022-01-29 ENCOUNTER — Encounter (HOSPITAL_BASED_OUTPATIENT_CLINIC_OR_DEPARTMENT_OTHER): Payer: Self-pay

## 2022-01-29 ENCOUNTER — Ambulatory Visit (HOSPITAL_BASED_OUTPATIENT_CLINIC_OR_DEPARTMENT_OTHER)
Admission: RE | Admit: 2022-01-29 | Discharge: 2022-01-29 | Disposition: A | Discharge status: Home / Self Care | Payer: BC Managed Care – PPO | Source: Ambulatory Visit | Attending: Obstetrics and Gynecology | Admitting: Obstetrics and Gynecology

## 2022-01-29 DIAGNOSIS — Z1231 Encounter for screening mammogram for malignant neoplasm of breast: Secondary | ICD-10-CM | POA: Diagnosis not present

## 2022-04-11 ENCOUNTER — Ambulatory Visit: Payer: BC Managed Care – PPO | Admitting: Physician Assistant

## 2022-10-02 DIAGNOSIS — Z4681 Encounter for fitting and adjustment of insulin pump: Secondary | ICD-10-CM | POA: Diagnosis not present

## 2022-10-02 DIAGNOSIS — E7800 Pure hypercholesterolemia, unspecified: Secondary | ICD-10-CM | POA: Diagnosis not present

## 2022-10-02 DIAGNOSIS — E109 Type 1 diabetes mellitus without complications: Secondary | ICD-10-CM | POA: Diagnosis not present

## 2022-10-02 DIAGNOSIS — Z794 Long term (current) use of insulin: Secondary | ICD-10-CM | POA: Diagnosis not present

## 2022-10-02 DIAGNOSIS — I10 Essential (primary) hypertension: Secondary | ICD-10-CM | POA: Diagnosis not present

## 2022-11-12 DIAGNOSIS — E109 Type 1 diabetes mellitus without complications: Secondary | ICD-10-CM | POA: Diagnosis not present

## 2022-12-11 DIAGNOSIS — H2513 Age-related nuclear cataract, bilateral: Secondary | ICD-10-CM | POA: Diagnosis not present

## 2022-12-11 DIAGNOSIS — E109 Type 1 diabetes mellitus without complications: Secondary | ICD-10-CM | POA: Diagnosis not present

## 2022-12-11 DIAGNOSIS — H5213 Myopia, bilateral: Secondary | ICD-10-CM | POA: Diagnosis not present

## 2022-12-11 DIAGNOSIS — H43391 Other vitreous opacities, right eye: Secondary | ICD-10-CM | POA: Diagnosis not present

## 2022-12-11 DIAGNOSIS — M351 Other overlap syndromes: Secondary | ICD-10-CM | POA: Diagnosis not present

## 2022-12-11 DIAGNOSIS — H43812 Vitreous degeneration, left eye: Secondary | ICD-10-CM | POA: Diagnosis not present

## 2022-12-11 DIAGNOSIS — H25013 Cortical age-related cataract, bilateral: Secondary | ICD-10-CM | POA: Diagnosis not present

## 2022-12-11 DIAGNOSIS — H16223 Keratoconjunctivitis sicca, not specified as Sjogren's, bilateral: Secondary | ICD-10-CM | POA: Diagnosis not present

## 2022-12-11 DIAGNOSIS — Z794 Long term (current) use of insulin: Secondary | ICD-10-CM | POA: Diagnosis not present

## 2022-12-27 ENCOUNTER — Encounter: Payer: Self-pay | Admitting: Obstetrics and Gynecology

## 2022-12-27 ENCOUNTER — Ambulatory Visit: Payer: Medicare PPO | Admitting: Obstetrics and Gynecology

## 2022-12-27 VITALS — BP 120/80 | HR 72 | Ht 64.0 in | Wt 189.0 lb

## 2022-12-27 DIAGNOSIS — Z01419 Encounter for gynecological examination (general) (routine) without abnormal findings: Secondary | ICD-10-CM

## 2022-12-27 DIAGNOSIS — N952 Postmenopausal atrophic vaginitis: Secondary | ICD-10-CM | POA: Diagnosis not present

## 2022-12-27 DIAGNOSIS — M810 Age-related osteoporosis without current pathological fracture: Secondary | ICD-10-CM | POA: Diagnosis not present

## 2022-12-27 DIAGNOSIS — R32 Unspecified urinary incontinence: Secondary | ICD-10-CM | POA: Diagnosis not present

## 2022-12-27 DIAGNOSIS — D219 Benign neoplasm of connective and other soft tissue, unspecified: Secondary | ICD-10-CM

## 2022-12-27 DIAGNOSIS — M8000XA Age-related osteoporosis with current pathological fracture, unspecified site, initial encounter for fracture: Secondary | ICD-10-CM

## 2022-12-27 MED ORDER — ESTRADIOL 0.1 MG/GM VA CREA: 1.0000 | TOPICAL_CREAM | VAGINAL | 12 refills | Status: DC

## 2022-12-27 NOTE — Progress Notes (Addendum)
67 y.o. y.o. MarriedCaucasianF here for annual exam.  The patient had an ultrasound she reports about 5 years ago in [place]. She denies any PM bleeding. She stopped her periods several years ago. 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  HRT: denies Pelvic discharge: denies Pelvic pain:denies  Last : mammogram: 10/23.  Mother and Aunt with breast cancer Last colonoscopy states she is due again in 5 years   Blood pressure 120/80, pulse 72, height 5\' 4"  (1.626 m), weight 189 lb (85.7 kg), last menstrual period 03/31/2011, SpO2 99%.  GYN HISTORY:    Component Value Date/Time   DIAGPAP  12/25/2021 1517    - Negative for intraepithelial lesion or malignancy (NILM)   DIAGPAP  04/25/2017 0000    NEGATIVE FOR INTRAEPITHELIAL LESIONS OR MALIGNANCY.   HPVHIGH Negative 12/25/2021 1517   ADEQPAP  12/25/2021 1517    Satisfactory for evaluation; transformation zone component PRESENT.   ADEQPAP  04/25/2017 0000    Satisfactory for evaluation  endocervical/transformation zone component PRESENT.   Denies any abnormal pap smears or STI's OB History  Gravida Para Term Preterm AB Living  0 0 0 0 0 0  SAB IAB Ectopic Multiple Live Births  0 0 0 0    Obstetric Comments  1 adopted     Past Medical History:  Diagnosis Date   Allergy    Anemia    Anxiety    Arthritis    BCC (basal cell carcinoma of skin) 10/28/2018   SUPERFICIAL- LEFT TIP OF SHOULDER- TX CURET AFTER BIOPSY   Diabetes mellitus type 1 (HCC)    age 25   Fibroid    Hx of adenomatous polyp of colon 03/14/2012   02/2012 - small adenoma([place]) 07/12/2017 4 and 10 mm polyps    Hyperlipidemia    Hypertension    Melanoma (HCC) 06/21/2017   IN SITU- MID CHEST- TX MOHS   SCCA (squamous cell carcinoma) of skin 02/26/2018   UPPER FOREHEAD MID- TX CURET X 3, 5FU   SCCA (squamous cell carcinoma) of skin 10/28/2018   WELL DIFF- RIGHT FOREHEAD- TX CURET AFTER BIOPSY   Shingles     Past Surgical History:   Procedure Laterality Date   BREAST EXCISIONAL BIOPSY Right    COLONOSCOPY     DILATION AND CURETTAGE OF UTERUS     FOOT SURGERY Left age 18   right breast bx     benign mass 07-02-17   TONSILLECTOMY AND ADENOIDECTOMY      Current Outpatient Medications on File Prior to Visit  Medication Sig Dispense Refill   aspirin 81 MG tablet Take 81 mg by mouth daily.     carvedilol (COREG) 6.25 MG tablet Take 6.25 mg by mouth 2 (two) times daily with a meal.     celecoxib (CELEBREX) 200 MG capsule Take 200 mg by mouth daily.     losartan-hydrochlorothiazide (HYZAAR) 50-12.5 MG tablet Take 1 tablet by mouth daily.     Multiple Vitamin (MULTI-VITAMIN PO) Take by mouth.     NOVOLOG 100 UNIT/ML injection Insulin pump     polycarbophil (FIBERCON) 625 MG tablet 2 tablets as needed     rosuvastatin (CRESTOR) 40 MG tablet Take 40 mg by mouth daily.     sertraline (ZOLOFT) 50 MG tablet      simvastatin (ZOCOR) 40 MG tablet Take 40 mg by mouth daily.     No current facility-administered medications on file prior to visit.    Social  History   Socioeconomic History   Marital status: Married    Spouse name: Not on file   Number of children: Not on file   Years of education: Not on file   Highest education level: Not on file  Occupational History   Not on file  Tobacco Use   Smoking status: Never   Smokeless tobacco: Never  Vaping Use   Vaping status: Never Used  Substance and Sexual Activity   Alcohol use: Yes    Alcohol/week: 7.0 standard drinks of alcohol    Types: 7 Glasses of wine per week   Drug use: No   Sexual activity: Not Currently    Partners: Male    Birth control/protection: Post-menopausal    Comment: older than 18, less than 5  Other Topics Concern   Not on file  Social History Narrative   11/16: she has one adopted son, adult, working locally. She is an Acupuncturist, working in the school system. Husband is a Gaffer (since the 74's), she is his caretaker.     Social Determinants of Health   Financial Resource Strain: Not on file  Food Insecurity: Not on file  Transportation Needs: Not on file  Physical Activity: Not on file  Stress: Not on file  Social Connections: Not on file  Intimate Partner Violence: Not on file    Family History  Problem Relation Age of Onset   Breast cancer Mother 17   Colon polyps Father    Transient ischemic attack Sister    Breast cancer Maternal Aunt 20   Colon cancer Neg Hx    Esophageal cancer Neg Hx    Liver cancer Neg Hx    Pancreatic cancer Neg Hx    Rectal cancer Neg Hx    Stomach cancer Neg Hx      No Known Allergies    Patient's last menstrual period was Patient's last menstrual period was 03/31/2011.Marland Kitchen          Sexually active: No.     Review of Systems Endocrine: hot flashes, but declines HRT  All other systems reviewed and are negative.     Exam:      General appearance: alert, cooperative and appears stated age Head: Normocephalic, without obvious abnormality, atraumatic Neck: no adenopathy, supple, symmetrical, trachea midline and thyroid normal to inspection and palpation Lungs: clear to auscultation bilaterally Breasts: normal appearance, no masses or tenderness Heart: regular rate and rhythm Abdomen: soft, non-tender; bowel sounds normal; no masses,  no organomegaly Extremities: extremities normal, atraumatic, no cyanosis or edema Skin: Skin color, texture, turgor normal. No rashes or lesions Lymph nodes: Cervical, supraclavicular, and axillary nodes normal. No abnormal inguinal nodes palpated Neurologic: Grossly normal     Pelvic: External genitalia:  no lesions              Urethra:  normal appearing urethra with no masses, tenderness or lesions              Bartholins and Skenes: normal                 Vagina: normal appearing vagina with normal color and discharge, no lesions. Very atrophic              Cervix: no lesions               Bimanual Exam:  Uterus:   normal size, contour, position, consistency, mobility, non-tender              Adnexa:  no mass, fullness, tenderness          Chaperone was present for exam.   A:         Well Woman GYN exam, medicare, atrophic vaginitis                             P:         No pap needed. Repeat next year             Mammogram order placed.  Encouraged annual screening             Colonoscopy due in 5 years             Labs and immunizations with her primary             Estrace for atrophic vaginitis  enouraged healthy lifestyle practices with diet and exercise Osteoperosis changes on last dexa:  repeat placed.  Encouraged patient to discuss with PMD as well H/o fibroids: to have TV US and return to discuss results.  Earley Favor

## 2022-12-29 LAB — URINE CULTURE
MICRO NUMBER:: 15399667
Result:: NO GROWTH
SPECIMEN QUALITY:: ADEQUATE

## 2022-12-29 LAB — URINALYSIS, COMPLETE W/RFL CULTURE
Hgb urine dipstick: NEGATIVE
Leukocyte Esterase: NEGATIVE
Nitrites, Initial: NEGATIVE
RBC / HPF: NONE SEEN /HPF (ref 0–2)
Specific Gravity, Urine: 1.026 (ref 1.001–1.035)
pH: 5.5 (ref 5.0–8.0)

## 2022-12-29 LAB — CULTURE INDICATED

## 2023-01-01 ENCOUNTER — Other Ambulatory Visit: Payer: Self-pay | Admitting: *Deleted

## 2023-01-01 DIAGNOSIS — D219 Benign neoplasm of connective and other soft tissue, unspecified: Secondary | ICD-10-CM

## 2023-01-14 DIAGNOSIS — Z4681 Encounter for fitting and adjustment of insulin pump: Secondary | ICD-10-CM | POA: Diagnosis not present

## 2023-01-14 DIAGNOSIS — E669 Obesity, unspecified: Secondary | ICD-10-CM | POA: Diagnosis not present

## 2023-01-14 DIAGNOSIS — E109 Type 1 diabetes mellitus without complications: Secondary | ICD-10-CM | POA: Diagnosis not present

## 2023-01-14 DIAGNOSIS — F419 Anxiety disorder, unspecified: Secondary | ICD-10-CM | POA: Diagnosis not present

## 2023-01-14 DIAGNOSIS — H04129 Dry eye syndrome of unspecified lacrimal gland: Secondary | ICD-10-CM | POA: Diagnosis not present

## 2023-01-14 DIAGNOSIS — E7800 Pure hypercholesterolemia, unspecified: Secondary | ICD-10-CM | POA: Diagnosis not present

## 2023-01-14 DIAGNOSIS — Z23 Encounter for immunization: Secondary | ICD-10-CM | POA: Diagnosis not present

## 2023-01-14 DIAGNOSIS — I10 Essential (primary) hypertension: Secondary | ICD-10-CM | POA: Diagnosis not present

## 2023-01-23 ENCOUNTER — Encounter: Payer: Self-pay | Admitting: Obstetrics and Gynecology

## 2023-01-23 DIAGNOSIS — M8000XA Age-related osteoporosis with current pathological fracture, unspecified site, initial encounter for fracture: Secondary | ICD-10-CM

## 2023-01-31 DIAGNOSIS — D1801 Hemangioma of skin and subcutaneous tissue: Secondary | ICD-10-CM | POA: Diagnosis not present

## 2023-01-31 DIAGNOSIS — Z85820 Personal history of malignant melanoma of skin: Secondary | ICD-10-CM | POA: Diagnosis not present

## 2023-01-31 DIAGNOSIS — L570 Actinic keratosis: Secondary | ICD-10-CM | POA: Diagnosis not present

## 2023-01-31 DIAGNOSIS — L821 Other seborrheic keratosis: Secondary | ICD-10-CM | POA: Diagnosis not present

## 2023-01-31 DIAGNOSIS — L814 Other melanin hyperpigmentation: Secondary | ICD-10-CM | POA: Diagnosis not present

## 2023-02-11 ENCOUNTER — Ambulatory Visit (HOSPITAL_BASED_OUTPATIENT_CLINIC_OR_DEPARTMENT_OTHER)
Admission: RE | Admit: 2023-02-11 | Discharge: 2023-02-11 | Disposition: A | Discharge status: Home / Self Care | Payer: Medicare PPO | Source: Ambulatory Visit | Attending: Obstetrics and Gynecology | Admitting: Obstetrics and Gynecology

## 2023-02-11 ENCOUNTER — Encounter (HOSPITAL_BASED_OUTPATIENT_CLINIC_OR_DEPARTMENT_OTHER): Payer: Self-pay

## 2023-02-11 DIAGNOSIS — E119 Type 2 diabetes mellitus without complications: Secondary | ICD-10-CM | POA: Diagnosis not present

## 2023-02-11 DIAGNOSIS — D259 Leiomyoma of uterus, unspecified: Secondary | ICD-10-CM | POA: Diagnosis not present

## 2023-02-11 DIAGNOSIS — Z1231 Encounter for screening mammogram for malignant neoplasm of breast: Secondary | ICD-10-CM | POA: Insufficient documentation

## 2023-02-11 DIAGNOSIS — M8000XA Age-related osteoporosis with current pathological fracture, unspecified site, initial encounter for fracture: Secondary | ICD-10-CM

## 2023-02-11 DIAGNOSIS — M85852 Other specified disorders of bone density and structure, left thigh: Secondary | ICD-10-CM | POA: Insufficient documentation

## 2023-02-11 DIAGNOSIS — Z01419 Encounter for gynecological examination (general) (routine) without abnormal findings: Secondary | ICD-10-CM | POA: Insufficient documentation

## 2023-02-11 DIAGNOSIS — Z780 Asymptomatic menopausal state: Secondary | ICD-10-CM | POA: Diagnosis not present

## 2023-02-11 DIAGNOSIS — D219 Benign neoplasm of connective and other soft tissue, unspecified: Secondary | ICD-10-CM | POA: Diagnosis not present

## 2023-02-11 DIAGNOSIS — N858 Other specified noninflammatory disorders of uterus: Secondary | ICD-10-CM | POA: Diagnosis not present

## 2023-02-11 DIAGNOSIS — M85851 Other specified disorders of bone density and structure, right thigh: Secondary | ICD-10-CM | POA: Diagnosis not present

## 2023-02-13 DIAGNOSIS — E109 Type 1 diabetes mellitus without complications: Secondary | ICD-10-CM | POA: Diagnosis not present

## 2023-02-18 DIAGNOSIS — M79645 Pain in left finger(s): Secondary | ICD-10-CM | POA: Diagnosis not present

## 2023-02-18 DIAGNOSIS — L03012 Cellulitis of left finger: Secondary | ICD-10-CM | POA: Diagnosis not present

## 2023-02-18 DIAGNOSIS — Z794 Long term (current) use of insulin: Secondary | ICD-10-CM | POA: Diagnosis not present

## 2023-02-18 DIAGNOSIS — I10 Essential (primary) hypertension: Secondary | ICD-10-CM | POA: Diagnosis not present

## 2023-02-18 DIAGNOSIS — E109 Type 1 diabetes mellitus without complications: Secondary | ICD-10-CM | POA: Diagnosis not present

## 2023-02-22 ENCOUNTER — Encounter: Payer: Self-pay | Admitting: Obstetrics and Gynecology

## 2023-03-18 ENCOUNTER — Encounter: Payer: Self-pay | Admitting: Obstetrics and Gynecology

## 2023-04-04 DIAGNOSIS — Z794 Long term (current) use of insulin: Secondary | ICD-10-CM | POA: Diagnosis not present

## 2023-04-04 DIAGNOSIS — Z4681 Encounter for fitting and adjustment of insulin pump: Secondary | ICD-10-CM | POA: Diagnosis not present

## 2023-04-04 DIAGNOSIS — E7800 Pure hypercholesterolemia, unspecified: Secondary | ICD-10-CM | POA: Diagnosis not present

## 2023-04-04 DIAGNOSIS — I10 Essential (primary) hypertension: Secondary | ICD-10-CM | POA: Diagnosis not present

## 2023-04-04 DIAGNOSIS — E109 Type 1 diabetes mellitus without complications: Secondary | ICD-10-CM | POA: Diagnosis not present

## 2023-04-09 ENCOUNTER — Encounter: Payer: Self-pay | Admitting: Obstetrics and Gynecology

## 2023-04-09 ENCOUNTER — Ambulatory Visit: Payer: Medicare PPO | Admitting: Obstetrics and Gynecology

## 2023-04-09 VITALS — BP 116/70 | HR 67

## 2023-04-09 DIAGNOSIS — D259 Leiomyoma of uterus, unspecified: Secondary | ICD-10-CM | POA: Diagnosis not present

## 2023-04-09 DIAGNOSIS — Z712 Person consulting for explanation of examination or test findings: Secondary | ICD-10-CM

## 2023-04-09 NOTE — Progress Notes (Signed)
Patient presents to discuss [place] results for fibroid follow-up She denies any PMB or pelvic pain  Blood pressure 116/70, pulse 67, last menstrual period 03/31/2011, SpO2 97%.   CLINICAL DATA:  History of fibroid   EXAM: TRANSABDOMINAL AND TRANSVAGINAL ULTRASOUND OF PELVIS   TECHNIQUE: Both transabdominal and transvaginal ultrasound examinations of the pelvis were performed. Transabdominal technique was performed for global imaging of the pelvis including uterus, ovaries, adnexal regions, and pelvic cul-de-sac. It was necessary to proceed with endovaginal exam following the transabdominal exam to visualize the uterus and adnexa.   COMPARISON:  None Available.   FINDINGS: Uterus   Measurements: 8 x 4.8 x 7.2 cm = volume: 145.2 mL. Hypoechoic uterine masses presumably representing fibroids. Right anterior subserosal fundal fibroid measuring 1.9 x 1.1 x 1.7 cm. Echogenic mass probably representing calcified fibroid, within the sub mucosal uterine fundus measuring 1.2 x 1.2 x 0.9 cm. Anterior uterine corpus fibroid measuring 2.1 by 2.9 x 2.6 cm.   Endometrium   Thickness: 4.7 mm.  No focal abnormality visualized.   Right ovary   Not seen   Left ovary   Not seen.   Other findings   No abnormal free fluid.   IMPRESSION: Multiple uterine fibroids. Nonvisualized ovaries.     Electronically Signed   By: Jasmine Pang M.D.   On: 03/03/2023 20:13  A/p TV US results  [place] results printed and reviewed with patient. Encouraged patient that we can continue with conservative management and she should return with any PM bleeding in the future and she agreed.  15 minutes spent on reviewing records, imaging,  and one on one patient time and counseling patient and documentation Dr. Karma Greaser

## 2023-05-21 DIAGNOSIS — E109 Type 1 diabetes mellitus without complications: Secondary | ICD-10-CM | POA: Diagnosis not present

## 2023-05-23 DIAGNOSIS — E109 Type 1 diabetes mellitus without complications: Secondary | ICD-10-CM | POA: Diagnosis not present

## 2023-07-10 DIAGNOSIS — M79641 Pain in right hand: Secondary | ICD-10-CM | POA: Diagnosis not present

## 2023-07-10 DIAGNOSIS — R202 Paresthesia of skin: Secondary | ICD-10-CM | POA: Diagnosis not present

## 2023-07-10 DIAGNOSIS — R200 Anesthesia of skin: Secondary | ICD-10-CM | POA: Diagnosis not present

## 2023-07-10 DIAGNOSIS — M1811 Unilateral primary osteoarthritis of first carpometacarpal joint, right hand: Secondary | ICD-10-CM | POA: Diagnosis not present

## 2023-07-19 DIAGNOSIS — E781 Pure hyperglyceridemia: Secondary | ICD-10-CM | POA: Diagnosis not present

## 2023-07-19 DIAGNOSIS — E109 Type 1 diabetes mellitus without complications: Secondary | ICD-10-CM | POA: Diagnosis not present

## 2023-07-19 DIAGNOSIS — I10 Essential (primary) hypertension: Secondary | ICD-10-CM | POA: Diagnosis not present

## 2023-07-19 DIAGNOSIS — E7800 Pure hypercholesterolemia, unspecified: Secondary | ICD-10-CM | POA: Diagnosis not present

## 2023-07-19 DIAGNOSIS — Z1212 Encounter for screening for malignant neoplasm of rectum: Secondary | ICD-10-CM | POA: Diagnosis not present

## 2023-07-26 DIAGNOSIS — I10 Essential (primary) hypertension: Secondary | ICD-10-CM | POA: Diagnosis not present

## 2023-07-26 DIAGNOSIS — Z1331 Encounter for screening for depression: Secondary | ICD-10-CM | POA: Diagnosis not present

## 2023-07-26 DIAGNOSIS — F419 Anxiety disorder, unspecified: Secondary | ICD-10-CM | POA: Diagnosis not present

## 2023-07-26 DIAGNOSIS — L600 Ingrowing nail: Secondary | ICD-10-CM | POA: Diagnosis not present

## 2023-07-26 DIAGNOSIS — E7800 Pure hypercholesterolemia, unspecified: Secondary | ICD-10-CM | POA: Diagnosis not present

## 2023-07-26 DIAGNOSIS — E109 Type 1 diabetes mellitus without complications: Secondary | ICD-10-CM | POA: Diagnosis not present

## 2023-07-26 DIAGNOSIS — R82998 Other abnormal findings in urine: Secondary | ICD-10-CM | POA: Diagnosis not present

## 2023-07-26 DIAGNOSIS — E669 Obesity, unspecified: Secondary | ICD-10-CM | POA: Diagnosis not present

## 2023-07-26 DIAGNOSIS — Z4681 Encounter for fitting and adjustment of insulin pump: Secondary | ICD-10-CM | POA: Diagnosis not present

## 2023-07-26 DIAGNOSIS — Z0000 Encounter for general adult medical examination without abnormal findings: Secondary | ICD-10-CM | POA: Diagnosis not present

## 2023-07-31 DIAGNOSIS — Z7982 Long term (current) use of aspirin: Secondary | ICD-10-CM | POA: Diagnosis not present

## 2023-07-31 DIAGNOSIS — R55 Syncope and collapse: Secondary | ICD-10-CM | POA: Diagnosis not present

## 2023-07-31 DIAGNOSIS — S199XXA Unspecified injury of neck, initial encounter: Secondary | ICD-10-CM | POA: Diagnosis not present

## 2023-07-31 DIAGNOSIS — M50222 Other cervical disc displacement at C5-C6 level: Secondary | ICD-10-CM | POA: Diagnosis not present

## 2023-07-31 DIAGNOSIS — Z79899 Other long term (current) drug therapy: Secondary | ICD-10-CM | POA: Diagnosis not present

## 2023-07-31 DIAGNOSIS — S0990XA Unspecified injury of head, initial encounter: Secondary | ICD-10-CM | POA: Diagnosis not present

## 2023-07-31 DIAGNOSIS — I10 Essential (primary) hypertension: Secondary | ICD-10-CM | POA: Diagnosis not present

## 2023-08-04 DIAGNOSIS — Z79899 Other long term (current) drug therapy: Secondary | ICD-10-CM | POA: Diagnosis not present

## 2023-08-04 DIAGNOSIS — R519 Headache, unspecified: Secondary | ICD-10-CM | POA: Diagnosis not present

## 2023-08-04 DIAGNOSIS — E876 Hypokalemia: Secondary | ICD-10-CM | POA: Diagnosis not present

## 2023-08-04 DIAGNOSIS — E109 Type 1 diabetes mellitus without complications: Secondary | ICD-10-CM | POA: Diagnosis not present

## 2023-08-04 DIAGNOSIS — R202 Paresthesia of skin: Secondary | ICD-10-CM | POA: Diagnosis not present

## 2023-08-04 DIAGNOSIS — H81399 Other peripheral vertigo, unspecified ear: Secondary | ICD-10-CM | POA: Diagnosis not present

## 2023-08-04 DIAGNOSIS — G459 Transient cerebral ischemic attack, unspecified: Secondary | ICD-10-CM | POA: Diagnosis not present

## 2023-08-04 DIAGNOSIS — Z7982 Long term (current) use of aspirin: Secondary | ICD-10-CM | POA: Diagnosis not present

## 2023-08-04 DIAGNOSIS — R531 Weakness: Secondary | ICD-10-CM | POA: Diagnosis not present

## 2023-08-04 DIAGNOSIS — R42 Dizziness and giddiness: Secondary | ICD-10-CM | POA: Diagnosis not present

## 2023-08-04 DIAGNOSIS — R200 Anesthesia of skin: Secondary | ICD-10-CM | POA: Diagnosis not present

## 2023-08-05 DIAGNOSIS — R200 Anesthesia of skin: Secondary | ICD-10-CM | POA: Diagnosis not present

## 2023-08-05 DIAGNOSIS — I517 Cardiomegaly: Secondary | ICD-10-CM | POA: Diagnosis not present

## 2023-08-05 DIAGNOSIS — I340 Nonrheumatic mitral (valve) insufficiency: Secondary | ICD-10-CM | POA: Diagnosis not present

## 2023-08-05 DIAGNOSIS — G459 Transient cerebral ischemic attack, unspecified: Secondary | ICD-10-CM | POA: Diagnosis not present

## 2023-08-05 DIAGNOSIS — R55 Syncope and collapse: Secondary | ICD-10-CM | POA: Diagnosis not present

## 2023-08-05 DIAGNOSIS — R9431 Abnormal electrocardiogram [ECG] [EKG]: Secondary | ICD-10-CM | POA: Diagnosis not present

## 2023-08-05 DIAGNOSIS — E876 Hypokalemia: Secondary | ICD-10-CM | POA: Diagnosis not present

## 2023-08-05 DIAGNOSIS — R42 Dizziness and giddiness: Secondary | ICD-10-CM | POA: Diagnosis not present

## 2023-08-08 DIAGNOSIS — R55 Syncope and collapse: Secondary | ICD-10-CM | POA: Diagnosis not present

## 2023-08-08 DIAGNOSIS — E109 Type 1 diabetes mellitus without complications: Secondary | ICD-10-CM | POA: Diagnosis not present

## 2023-08-08 DIAGNOSIS — F419 Anxiety disorder, unspecified: Secondary | ICD-10-CM | POA: Diagnosis not present

## 2023-08-13 DIAGNOSIS — R200 Anesthesia of skin: Secondary | ICD-10-CM | POA: Diagnosis not present

## 2023-08-13 DIAGNOSIS — E785 Hyperlipidemia, unspecified: Secondary | ICD-10-CM | POA: Diagnosis not present

## 2023-08-13 DIAGNOSIS — R42 Dizziness and giddiness: Secondary | ICD-10-CM | POA: Diagnosis not present

## 2023-08-13 DIAGNOSIS — S0990XD Unspecified injury of head, subsequent encounter: Secondary | ICD-10-CM | POA: Diagnosis not present

## 2023-08-13 DIAGNOSIS — Z794 Long term (current) use of insulin: Secondary | ICD-10-CM | POA: Diagnosis not present

## 2023-08-13 DIAGNOSIS — Z7982 Long term (current) use of aspirin: Secondary | ICD-10-CM | POA: Diagnosis not present

## 2023-08-13 DIAGNOSIS — E109 Type 1 diabetes mellitus without complications: Secondary | ICD-10-CM | POA: Diagnosis not present

## 2023-08-13 DIAGNOSIS — I10 Essential (primary) hypertension: Secondary | ICD-10-CM | POA: Diagnosis not present

## 2023-08-13 DIAGNOSIS — W19XXXD Unspecified fall, subsequent encounter: Secondary | ICD-10-CM | POA: Diagnosis not present

## 2023-08-13 DIAGNOSIS — Z79899 Other long term (current) drug therapy: Secondary | ICD-10-CM | POA: Diagnosis not present

## 2023-08-14 DIAGNOSIS — L720 Epidermal cyst: Secondary | ICD-10-CM | POA: Diagnosis not present

## 2023-08-14 DIAGNOSIS — Z85820 Personal history of malignant melanoma of skin: Secondary | ICD-10-CM | POA: Diagnosis not present

## 2023-08-14 DIAGNOSIS — L814 Other melanin hyperpigmentation: Secondary | ICD-10-CM | POA: Diagnosis not present

## 2023-08-14 DIAGNOSIS — L821 Other seborrheic keratosis: Secondary | ICD-10-CM | POA: Diagnosis not present

## 2023-08-14 DIAGNOSIS — D1801 Hemangioma of skin and subcutaneous tissue: Secondary | ICD-10-CM | POA: Diagnosis not present

## 2023-08-20 DIAGNOSIS — R55 Syncope and collapse: Secondary | ICD-10-CM | POA: Diagnosis not present

## 2023-08-21 DIAGNOSIS — E109 Type 1 diabetes mellitus without complications: Secondary | ICD-10-CM | POA: Diagnosis not present

## 2023-08-21 DIAGNOSIS — R202 Paresthesia of skin: Secondary | ICD-10-CM | POA: Diagnosis not present

## 2023-08-21 DIAGNOSIS — G5603 Carpal tunnel syndrome, bilateral upper limbs: Secondary | ICD-10-CM | POA: Diagnosis not present

## 2023-08-21 DIAGNOSIS — R200 Anesthesia of skin: Secondary | ICD-10-CM | POA: Diagnosis not present

## 2023-08-31 DIAGNOSIS — I10 Essential (primary) hypertension: Secondary | ICD-10-CM | POA: Diagnosis not present

## 2023-08-31 DIAGNOSIS — R29818 Other symptoms and signs involving the nervous system: Secondary | ICD-10-CM | POA: Diagnosis not present

## 2023-08-31 DIAGNOSIS — Z794 Long term (current) use of insulin: Secondary | ICD-10-CM | POA: Diagnosis not present

## 2023-08-31 DIAGNOSIS — G969 Disorder of central nervous system, unspecified: Secondary | ICD-10-CM | POA: Diagnosis not present

## 2023-08-31 DIAGNOSIS — I639 Cerebral infarction, unspecified: Secondary | ICD-10-CM | POA: Diagnosis not present

## 2023-08-31 DIAGNOSIS — I609 Nontraumatic subarachnoid hemorrhage, unspecified: Secondary | ICD-10-CM | POA: Diagnosis not present

## 2023-08-31 DIAGNOSIS — I6200 Nontraumatic subdural hemorrhage, unspecified: Secondary | ICD-10-CM | POA: Diagnosis not present

## 2023-08-31 DIAGNOSIS — E785 Hyperlipidemia, unspecified: Secondary | ICD-10-CM | POA: Diagnosis not present

## 2023-08-31 DIAGNOSIS — Z79899 Other long term (current) drug therapy: Secondary | ICD-10-CM | POA: Diagnosis not present

## 2023-08-31 DIAGNOSIS — I2510 Atherosclerotic heart disease of native coronary artery without angina pectoris: Secondary | ICD-10-CM | POA: Diagnosis not present

## 2023-08-31 DIAGNOSIS — I6523 Occlusion and stenosis of bilateral carotid arteries: Secondary | ICD-10-CM | POA: Diagnosis not present

## 2023-08-31 DIAGNOSIS — S065XAA Traumatic subdural hemorrhage with loss of consciousness status unknown, initial encounter: Secondary | ICD-10-CM | POA: Diagnosis not present

## 2023-08-31 DIAGNOSIS — I517 Cardiomegaly: Secondary | ICD-10-CM | POA: Diagnosis not present

## 2023-08-31 DIAGNOSIS — Z7982 Long term (current) use of aspirin: Secondary | ICD-10-CM | POA: Diagnosis not present

## 2023-08-31 DIAGNOSIS — S066XAA Traumatic subarachnoid hemorrhage with loss of consciousness status unknown, initial encounter: Secondary | ICD-10-CM | POA: Diagnosis not present

## 2023-08-31 DIAGNOSIS — R202 Paresthesia of skin: Secondary | ICD-10-CM | POA: Diagnosis not present

## 2023-08-31 DIAGNOSIS — E1051 Type 1 diabetes mellitus with diabetic peripheral angiopathy without gangrene: Secondary | ICD-10-CM | POA: Diagnosis not present

## 2023-08-31 DIAGNOSIS — R42 Dizziness and giddiness: Secondary | ICD-10-CM | POA: Diagnosis not present

## 2023-09-01 DIAGNOSIS — I6523 Occlusion and stenosis of bilateral carotid arteries: Secondary | ICD-10-CM | POA: Diagnosis not present

## 2023-09-01 DIAGNOSIS — I6200 Nontraumatic subdural hemorrhage, unspecified: Secondary | ICD-10-CM | POA: Diagnosis not present

## 2023-09-01 DIAGNOSIS — I609 Nontraumatic subarachnoid hemorrhage, unspecified: Secondary | ICD-10-CM | POA: Diagnosis not present

## 2023-09-02 DIAGNOSIS — R42 Dizziness and giddiness: Secondary | ICD-10-CM | POA: Diagnosis not present

## 2023-09-02 DIAGNOSIS — I6200 Nontraumatic subdural hemorrhage, unspecified: Secondary | ICD-10-CM | POA: Diagnosis not present

## 2023-09-03 DIAGNOSIS — I6200 Nontraumatic subdural hemorrhage, unspecified: Secondary | ICD-10-CM | POA: Diagnosis not present

## 2023-09-05 DIAGNOSIS — S065X0A Traumatic subdural hemorrhage without loss of consciousness, initial encounter: Secondary | ICD-10-CM | POA: Diagnosis not present

## 2023-09-05 DIAGNOSIS — R9431 Abnormal electrocardiogram [ECG] [EKG]: Secondary | ICD-10-CM | POA: Diagnosis not present

## 2023-09-05 DIAGNOSIS — R531 Weakness: Secondary | ICD-10-CM | POA: Diagnosis not present

## 2023-09-05 DIAGNOSIS — R200 Anesthesia of skin: Secondary | ICD-10-CM | POA: Diagnosis not present

## 2023-09-05 DIAGNOSIS — S065XAA Traumatic subdural hemorrhage with loss of consciousness status unknown, initial encounter: Secondary | ICD-10-CM | POA: Diagnosis not present

## 2023-09-11 DIAGNOSIS — S199XXA Unspecified injury of neck, initial encounter: Secondary | ICD-10-CM | POA: Diagnosis not present

## 2023-09-11 DIAGNOSIS — W19XXXA Unspecified fall, initial encounter: Secondary | ICD-10-CM | POA: Diagnosis not present

## 2023-09-11 DIAGNOSIS — S0990XA Unspecified injury of head, initial encounter: Secondary | ICD-10-CM | POA: Diagnosis not present

## 2023-09-11 DIAGNOSIS — S0083XA Contusion of other part of head, initial encounter: Secondary | ICD-10-CM | POA: Diagnosis not present

## 2023-09-11 DIAGNOSIS — R42 Dizziness and giddiness: Secondary | ICD-10-CM | POA: Diagnosis not present

## 2023-09-11 DIAGNOSIS — I6203 Nontraumatic chronic subdural hemorrhage: Secondary | ICD-10-CM | POA: Diagnosis not present

## 2023-09-11 DIAGNOSIS — R55 Syncope and collapse: Secondary | ICD-10-CM | POA: Diagnosis not present

## 2023-09-11 DIAGNOSIS — S065X0A Traumatic subdural hemorrhage without loss of consciousness, initial encounter: Secondary | ICD-10-CM | POA: Diagnosis not present

## 2023-09-12 DIAGNOSIS — R55 Syncope and collapse: Secondary | ICD-10-CM | POA: Diagnosis not present

## 2023-09-26 ENCOUNTER — Encounter: Payer: Self-pay | Admitting: Podiatry

## 2023-09-26 ENCOUNTER — Ambulatory Visit: Admitting: Podiatry

## 2023-09-26 DIAGNOSIS — E1042 Type 1 diabetes mellitus with diabetic polyneuropathy: Secondary | ICD-10-CM

## 2023-09-26 DIAGNOSIS — E119 Type 2 diabetes mellitus without complications: Secondary | ICD-10-CM

## 2023-09-26 DIAGNOSIS — B351 Tinea unguium: Secondary | ICD-10-CM | POA: Diagnosis not present

## 2023-09-26 DIAGNOSIS — M79675 Pain in left toe(s): Secondary | ICD-10-CM | POA: Diagnosis not present

## 2023-09-26 DIAGNOSIS — M79674 Pain in right toe(s): Secondary | ICD-10-CM

## 2023-09-26 NOTE — Addendum Note (Signed)
 Addended by: Josafat Enrico R on: 09/26/2023 01:31 PM   Modules accepted: Level of Service

## 2023-09-26 NOTE — Progress Notes (Signed)
  Subjective:  Patient ID: Julia Nash, female    DOB: 10-12-1955,   MRN: [id number]  Chief Complaint  Patient presents with   Diabetes    "Both big toes have been ingrowing."  Dr. Tisovec - 08/08/2023; A1c - 7.5 N - ingrown toenails L - hallux bilateral D - 6 mos O - off and on C - was tender and sore, don't hurt presently, Diabetic A - shoes, sometime walking T - cut them and grab at them    68 y.o. female presents for concern as above and concern of thickened elongated and painful nails that are difficult to trim. Requesting to have them trimmed today. Relates burning and tingling in their feet. Patient is diabetic and last A1c was No results found for: "HGBA1C" .   PCP:  Tisovec, Richard W, MD    . Denies any other pedal complaints. Denies n/v/f/c.   Past Medical History:  Diagnosis Date   Allergy    Anemia    Anxiety    Arthritis    BCC (basal cell carcinoma of skin) 10/28/2018   SUPERFICIAL- LEFT TIP OF SHOULDER- TX CURET AFTER BIOPSY   Diabetes mellitus type 1 (HCC)    age 58   Fibroid    Hx of adenomatous polyp of colon 03/14/2012   02/2012 - small adenoma([place]) 07/12/2017 4 and 10 mm polyps    Hyperlipidemia    Hypertension    Melanoma (HCC) 06/21/2017   IN SITU- MID CHEST- TX MOHS   SCCA (squamous cell carcinoma) of skin 02/26/2018   UPPER FOREHEAD MID- TX CURET X 3, 5FU   SCCA (squamous cell carcinoma) of skin 10/28/2018   WELL DIFF- RIGHT FOREHEAD- TX CURET AFTER BIOPSY   Shingles     Objective:  Physical Exam: Vascular: DP/PT pulses 2/4 bilateral. CFT <3 seconds. Absent hair growth on digits. Edema noted to bilateral lower extremities. Xerosis noted bilaterally.  Skin. No lacerations or abrasions bilateral feet. Nails 1-5 bilateral  are thickened discolored and elongated with subungual debris.  Musculoskeletal: MMT 5/5 bilateral lower extremities in DF, PF, Inversion and Eversion. Deceased ROM in DF of ankle joint.  Neurological: Sensation intact to  light touch. Protective sensation diminished bilateral.    Assessment:   1. Pain due to onychomycosis of toenails of both feet   2. 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 -Discussed and educated patient on diabetic foot care, especially with  regards to the vascular, neurological and musculoskeletal systems.  -Stressed the importance of good glycemic control and the detriment of not  controlling glucose levels in relation to the foot. -Discussed supportive shoes at all times and checking feet regularly.  -Mechanically debrided all nails 1-5 bilateral using sterile nail nipper and filed with dremel without incident  -Answered all patient questions -Patient to return  in 3 months for at risk foot care -Patient advised to call the office if any problems or questions arise in the meantime.   Jennefer Moats, DPM

## 2023-09-27 DIAGNOSIS — R42 Dizziness and giddiness: Secondary | ICD-10-CM | POA: Diagnosis not present

## 2023-09-27 DIAGNOSIS — I6200 Nontraumatic subdural hemorrhage, unspecified: Secondary | ICD-10-CM | POA: Diagnosis not present

## 2023-10-03 DIAGNOSIS — M542 Cervicalgia: Secondary | ICD-10-CM | POA: Diagnosis not present

## 2023-10-03 DIAGNOSIS — I10 Essential (primary) hypertension: Secondary | ICD-10-CM | POA: Diagnosis not present

## 2023-10-03 DIAGNOSIS — G5601 Carpal tunnel syndrome, right upper limb: Secondary | ICD-10-CM | POA: Diagnosis not present

## 2023-10-03 DIAGNOSIS — E109 Type 1 diabetes mellitus without complications: Secondary | ICD-10-CM | POA: Diagnosis not present

## 2023-10-03 DIAGNOSIS — G5621 Lesion of ulnar nerve, right upper limb: Secondary | ICD-10-CM | POA: Diagnosis not present

## 2023-10-03 DIAGNOSIS — Z4681 Encounter for fitting and adjustment of insulin pump: Secondary | ICD-10-CM | POA: Diagnosis not present

## 2023-10-03 DIAGNOSIS — E7800 Pure hypercholesterolemia, unspecified: Secondary | ICD-10-CM | POA: Diagnosis not present

## 2023-10-03 DIAGNOSIS — Z794 Long term (current) use of insulin: Secondary | ICD-10-CM | POA: Diagnosis not present

## 2023-10-09 DIAGNOSIS — S065XAA Traumatic subdural hemorrhage with loss of consciousness status unknown, initial encounter: Secondary | ICD-10-CM | POA: Diagnosis not present

## 2023-10-23 DIAGNOSIS — G5601 Carpal tunnel syndrome, right upper limb: Secondary | ICD-10-CM | POA: Diagnosis not present

## 2023-11-19 DIAGNOSIS — E109 Type 1 diabetes mellitus without complications: Secondary | ICD-10-CM | POA: Diagnosis not present

## 2023-11-22 DIAGNOSIS — H6993 Unspecified Eustachian tube disorder, bilateral: Secondary | ICD-10-CM | POA: Diagnosis not present

## 2023-11-22 DIAGNOSIS — F419 Anxiety disorder, unspecified: Secondary | ICD-10-CM | POA: Diagnosis not present

## 2023-11-22 DIAGNOSIS — M542 Cervicalgia: Secondary | ICD-10-CM | POA: Diagnosis not present

## 2023-12-12 DIAGNOSIS — H2513 Age-related nuclear cataract, bilateral: Secondary | ICD-10-CM | POA: Diagnosis not present

## 2023-12-12 DIAGNOSIS — H43393 Other vitreous opacities, bilateral: Secondary | ICD-10-CM | POA: Diagnosis not present

## 2023-12-12 DIAGNOSIS — E103293 Type 1 diabetes mellitus with mild nonproliferative diabetic retinopathy without macular edema, bilateral: Secondary | ICD-10-CM | POA: Diagnosis not present

## 2023-12-12 DIAGNOSIS — H25013 Cortical age-related cataract, bilateral: Secondary | ICD-10-CM | POA: Diagnosis not present

## 2023-12-12 DIAGNOSIS — H52203 Unspecified astigmatism, bilateral: Secondary | ICD-10-CM | POA: Diagnosis not present

## 2023-12-12 DIAGNOSIS — H5213 Myopia, bilateral: Secondary | ICD-10-CM | POA: Diagnosis not present

## 2023-12-12 DIAGNOSIS — H04123 Dry eye syndrome of bilateral lacrimal glands: Secondary | ICD-10-CM | POA: Diagnosis not present

## 2023-12-12 DIAGNOSIS — H524 Presbyopia: Secondary | ICD-10-CM | POA: Diagnosis not present

## 2023-12-19 ENCOUNTER — Ambulatory Visit: Admitting: Podiatry

## 2023-12-19 ENCOUNTER — Encounter: Payer: Self-pay | Admitting: Podiatry

## 2023-12-19 DIAGNOSIS — M79674 Pain in right toe(s): Secondary | ICD-10-CM | POA: Diagnosis not present

## 2023-12-19 DIAGNOSIS — B351 Tinea unguium: Secondary | ICD-10-CM

## 2023-12-19 DIAGNOSIS — E1042 Type 1 diabetes mellitus with diabetic polyneuropathy: Secondary | ICD-10-CM

## 2023-12-19 DIAGNOSIS — M79675 Pain in left toe(s): Secondary | ICD-10-CM | POA: Diagnosis not present

## 2023-12-19 NOTE — Progress Notes (Signed)
  Subjective:  Patient ID: Julia Nash, female    DOB: 07/10/1955,   MRN: [id number]  Chief Complaint  Patient presents with   Diabetes    Check my toenails.  They got all red from a lot of walking.  The right big toe hurts today.    68 y.o. female presents for concern as above and concern of thickened elongated and painful nails that are difficult to trim. Requesting to have them trimmed today. Relates burning and tingling in their feet. Patient is diabetic and last A1c was No results found for: HGBA1C .   PCP:  Tisovec, Charlie ORN, MD    . Denies any other pedal complaints. Denies n/v/f/c.   Past Medical History:  Diagnosis Date   Allergy    Anemia    Anxiety    Arthritis    BCC (basal cell carcinoma of skin) 10/28/2018   SUPERFICIAL- LEFT TIP OF SHOULDER- TX CURET AFTER BIOPSY   Diabetes mellitus type 1 (HCC)    age 37   Fibroid    Hx of adenomatous polyp of colon 03/14/2012   02/2012 - small adenoma([place]) 07/12/2017 4 and 10 mm polyps    Hyperlipidemia    Hypertension    Melanoma (HCC) 06/21/2017   IN SITU- MID CHEST- TX MOHS   SCCA (squamous cell carcinoma) of skin 02/26/2018   UPPER FOREHEAD MID- TX CURET X 3, 5FU   SCCA (squamous cell carcinoma) of skin 10/28/2018   WELL DIFF- RIGHT FOREHEAD- TX CURET AFTER BIOPSY   Shingles     Objective:  Physical Exam: Vascular: DP/PT pulses 2/4 bilateral. CFT <3 seconds. Absent hair growth on digits. Edema noted to bilateral lower extremities. Xerosis noted bilaterally.  Skin. No lacerations or abrasions bilateral feet. Nails 1-5 bilateral  are thickened discolored and elongated with subungual debris.  Musculoskeletal: MMT 5/5 bilateral lower extremities in DF, PF, Inversion and Eversion. Deceased ROM in DF of ankle joint.  Neurological: Sensation intact to light touch. Protective sensation diminished bilateral.    Assessment:   1. Pain due to onychomycosis of toenails of both feet   2. Type 1 diabetes mellitus with  peripheral neuropathy (HCC)       Plan:  Patient was evaluated and treated and all questions answered. -Discussed and educated patient on diabetic foot care, especially with  regards to the vascular, neurological and musculoskeletal systems.  -Stressed the importance of good glycemic control and the detriment of not  controlling glucose levels in relation to the foot. -Discussed supportive shoes at all times and checking feet regularly.  -Mechanically debrided all nails 1-5 bilateral using sterile nail nipper and filed with dremel without incident  -Answered all patient questions -Patient to return  in 3 months for at risk foot care -Patient advised to call the office if any problems or questions arise in the meantime.   Asberry Failing, DPM

## 2023-12-27 ENCOUNTER — Ambulatory Visit: Admitting: Podiatry

## 2023-12-31 ENCOUNTER — Encounter: Payer: Medicare PPO | Admitting: Obstetrics and Gynecology

## 2024-01-01 ENCOUNTER — Encounter: Payer: Self-pay | Admitting: Obstetrics and Gynecology

## 2024-01-02 ENCOUNTER — Encounter: Payer: Self-pay | Admitting: Obstetrics and Gynecology

## 2024-01-02 ENCOUNTER — Encounter: Payer: Medicare PPO | Admitting: Obstetrics and Gynecology

## 2024-01-09 DIAGNOSIS — S065X9A Traumatic subdural hemorrhage with loss of consciousness of unspecified duration, initial encounter: Secondary | ICD-10-CM | POA: Diagnosis not present

## 2024-01-09 DIAGNOSIS — S065XAA Traumatic subdural hemorrhage with loss of consciousness status unknown, initial encounter: Secondary | ICD-10-CM | POA: Diagnosis not present

## 2024-01-13 ENCOUNTER — Other Ambulatory Visit (HOSPITAL_BASED_OUTPATIENT_CLINIC_OR_DEPARTMENT_OTHER): Payer: Self-pay | Admitting: Obstetrics and Gynecology

## 2024-01-13 DIAGNOSIS — Z1231 Encounter for screening mammogram for malignant neoplasm of breast: Secondary | ICD-10-CM

## 2024-01-15 DIAGNOSIS — I10 Essential (primary) hypertension: Secondary | ICD-10-CM | POA: Diagnosis not present

## 2024-01-15 DIAGNOSIS — E109 Type 1 diabetes mellitus without complications: Secondary | ICD-10-CM | POA: Diagnosis not present

## 2024-01-15 DIAGNOSIS — E669 Obesity, unspecified: Secondary | ICD-10-CM | POA: Diagnosis not present

## 2024-01-15 DIAGNOSIS — H04129 Dry eye syndrome of unspecified lacrimal gland: Secondary | ICD-10-CM | POA: Diagnosis not present

## 2024-01-15 DIAGNOSIS — Z4681 Encounter for fitting and adjustment of insulin pump: Secondary | ICD-10-CM | POA: Diagnosis not present

## 2024-01-15 DIAGNOSIS — E7800 Pure hypercholesterolemia, unspecified: Secondary | ICD-10-CM | POA: Diagnosis not present

## 2024-01-15 DIAGNOSIS — F419 Anxiety disorder, unspecified: Secondary | ICD-10-CM | POA: Diagnosis not present

## 2024-01-15 DIAGNOSIS — Z23 Encounter for immunization: Secondary | ICD-10-CM | POA: Diagnosis not present

## 2024-02-10 DIAGNOSIS — R569 Unspecified convulsions: Secondary | ICD-10-CM | POA: Diagnosis not present

## 2024-02-17 ENCOUNTER — Encounter (HOSPITAL_BASED_OUTPATIENT_CLINIC_OR_DEPARTMENT_OTHER): Payer: Self-pay

## 2024-02-17 ENCOUNTER — Ambulatory Visit (HOSPITAL_BASED_OUTPATIENT_CLINIC_OR_DEPARTMENT_OTHER)
Admission: RE | Admit: 2024-02-17 | Discharge: 2024-02-17 | Disposition: A | Discharge status: Home / Self Care | Source: Ambulatory Visit | Attending: Obstetrics and Gynecology | Admitting: Obstetrics and Gynecology

## 2024-02-17 DIAGNOSIS — Z1231 Encounter for screening mammogram for malignant neoplasm of breast: Secondary | ICD-10-CM | POA: Insufficient documentation

## 2024-02-17 DIAGNOSIS — E109 Type 1 diabetes mellitus without complications: Secondary | ICD-10-CM | POA: Diagnosis not present

## 2024-02-17 DIAGNOSIS — Z08 Encounter for follow-up examination after completed treatment for malignant neoplasm: Secondary | ICD-10-CM | POA: Diagnosis not present

## 2024-02-17 DIAGNOSIS — L814 Other melanin hyperpigmentation: Secondary | ICD-10-CM | POA: Diagnosis not present

## 2024-02-17 DIAGNOSIS — L821 Other seborrheic keratosis: Secondary | ICD-10-CM | POA: Diagnosis not present

## 2024-02-17 DIAGNOSIS — Z85820 Personal history of malignant melanoma of skin: Secondary | ICD-10-CM | POA: Diagnosis not present

## 2024-02-17 DIAGNOSIS — D1801 Hemangioma of skin and subcutaneous tissue: Secondary | ICD-10-CM | POA: Diagnosis not present

## 2024-02-20 ENCOUNTER — Ambulatory Visit: Payer: Self-pay | Admitting: Obstetrics and Gynecology

## 2024-02-26 ENCOUNTER — Encounter: Payer: Self-pay | Admitting: Obstetrics and Gynecology

## 2024-02-26 ENCOUNTER — Ambulatory Visit: Admitting: Obstetrics and Gynecology

## 2024-02-26 VITALS — BP 132/60 | HR 69 | Ht 64.25 in | Wt 171.0 lb

## 2024-02-26 DIAGNOSIS — F4321 Adjustment disorder with depressed mood: Secondary | ICD-10-CM

## 2024-02-26 NOTE — Progress Notes (Signed)
   Acute Office Visit  Subjective:    Patient ID: Julia Nash, female    DOB: 1955/11/08, 68 y.o.   MRN: [id number]   HPI 68 y.o. presents today for Follow-up (Medication review) . Patient no HRT use Presents to review medication Recently lost her husband and tearful. Has been working with support groups, church and family Currently on zoloft No h/s ideations Is going to swimming as often as she can Has triggers that makes her cry, but feels she can talk about her grief.  Patient's last menstrual period was 03/31/2011.    Review of Systems     Objective:    OBGyn Exam  BP 132/60 (BP Location: Right Arm, Patient Position: Sitting, Cuff Size: Normal)   Pulse 69   Ht 5' 4.25 (1.632 m)   Wt 171 lb (77.6 kg)   LMP 03/31/2011   SpO2 97%   BMI 29.12 kg/m  Wt Readings from Last 3 Encounters:  02/26/24 171 lb (77.6 kg)  12/27/22 189 lb (85.7 kg)  12/25/21 176 lb 9.6 oz (80.1 kg)        \  Assessment & Plan:  Situational depression  Encouraged continued involvement in support groups and family and church.   Continue current medication. RTC with any worsening s/s and with pap smear screening.  30 minutes spent on reviewing records, imaging,  and one on one patient time and counseling patient and documentation Dr. Glennon Almarie Julia Nash

## 2024-02-28 DIAGNOSIS — E10319 Type 1 diabetes mellitus with unspecified diabetic retinopathy without macular edema: Secondary | ICD-10-CM | POA: Diagnosis not present

## 2024-02-28 DIAGNOSIS — M1990 Unspecified osteoarthritis, unspecified site: Secondary | ICD-10-CM | POA: Diagnosis not present

## 2024-02-28 DIAGNOSIS — E1042 Type 1 diabetes mellitus with diabetic polyneuropathy: Secondary | ICD-10-CM | POA: Diagnosis not present

## 2024-02-28 DIAGNOSIS — F419 Anxiety disorder, unspecified: Secondary | ICD-10-CM | POA: Diagnosis not present

## 2024-02-28 DIAGNOSIS — E1036 Type 1 diabetes mellitus with diabetic cataract: Secondary | ICD-10-CM | POA: Diagnosis not present

## 2024-02-28 DIAGNOSIS — N1831 Chronic kidney disease, stage 3a: Secondary | ICD-10-CM | POA: Diagnosis not present

## 2024-02-28 DIAGNOSIS — F324 Major depressive disorder, single episode, in partial remission: Secondary | ICD-10-CM | POA: Diagnosis not present

## 2024-02-28 DIAGNOSIS — E785 Hyperlipidemia, unspecified: Secondary | ICD-10-CM | POA: Diagnosis not present

## 2024-02-28 DIAGNOSIS — G40909 Epilepsy, unspecified, not intractable, without status epilepticus: Secondary | ICD-10-CM | POA: Diagnosis not present

## 2024-03-20 ENCOUNTER — Encounter: Payer: Self-pay | Admitting: Podiatry

## 2024-03-20 ENCOUNTER — Ambulatory Visit: Admitting: Podiatry

## 2024-03-20 DIAGNOSIS — M79675 Pain in left toe(s): Secondary | ICD-10-CM

## 2024-03-20 DIAGNOSIS — B351 Tinea unguium: Secondary | ICD-10-CM

## 2024-03-20 DIAGNOSIS — E1042 Type 1 diabetes mellitus with diabetic polyneuropathy: Secondary | ICD-10-CM

## 2024-03-20 DIAGNOSIS — M79674 Pain in right toe(s): Secondary | ICD-10-CM

## 2024-03-20 DIAGNOSIS — M76822 Posterior tibial tendinitis, left leg: Secondary | ICD-10-CM

## 2024-03-20 NOTE — Progress Notes (Signed)
  Subjective:  Patient ID: Julia Nash, female    DOB: May 06, 1955,   MRN: [id number]  No chief complaint on file.   68 y.o. female presents for concern as above and concern of thickened elongated and painful nails that are difficult to trim. Requesting to have them trimmed today. Relates burning and tingling in their feet. Patient is diabetic and last A1c was No results found for: HGBA1C .   PCP:  Tisovec, Charlie ORN, MD    . Denies any other pedal complaints. Denies n/v/f/c.   Past Medical History:  Diagnosis Date   Allergy    Anemia    Anxiety    Arthritis    BCC (basal cell carcinoma of skin) 10/28/2018   SUPERFICIAL- LEFT TIP OF SHOULDER- TX CURET AFTER BIOPSY   Diabetes mellitus type 1 (HCC)    age 56   Fibroid    Hx of adenomatous polyp of colon 03/14/2012   02/2012 - small adenoma([place]) 07/12/2017 4 and 10 mm polyps    Hyperlipidemia    Hypertension    Melanoma (HCC) 06/21/2017   IN SITU- MID CHEST- TX MOHS   SCCA (squamous cell carcinoma) of skin 02/26/2018   UPPER FOREHEAD MID- TX CURET X 3, 5FU   SCCA (squamous cell carcinoma) of skin 10/28/2018   WELL DIFF- RIGHT FOREHEAD- TX CURET AFTER BIOPSY   Shingles     Objective:  Physical Exam: Vascular: DP/PT pulses 2/4 bilateral. CFT <3 seconds. Absent hair growth on digits. Edema noted to bilateral lower extremities. Xerosis noted bilaterally.  Skin. No lacerations or abrasions bilateral feet. Nails 1-5 bilateral  are thickened discolored and elongated with subungual debris.  Musculoskeletal: MMT 5/5 bilateral lower extremities in DF, PF, Inversion and Eversion. Deceased ROM in DF of ankle joint.  Neurological: Sensation intact to light touch. Protective sensation diminished bilateral.    Assessment:   1. Pain due to onychomycosis of toenails of both feet   2. 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 -Discussed  and educated patient on diabetic foot care, especially with  regards to the vascular, neurological and musculoskeletal systems.  -Stressed the importance of good glycemic control and the detriment of not  controlling glucose levels in relation to the foot. -Discussed supportive shoes at all times and checking feet regularly.  -Mechanically debrided all nails 1-5 bilateral using sterile nail nipper and filed with dremel without incident  -Discussed CMO for pains on inside of foot likely from irritation around posterior tibial tendon. Will get fitted in [place]. Needs prior authorization.  -Answered all patient questions -Patient to return  in 3 months for at risk foot care -Patient advised to call the office if any problems or questions arise in the meantime.   Asberry Failing, DPM

## 2024-05-19 ENCOUNTER — Encounter: Payer: Self-pay | Admitting: Obstetrics and Gynecology

## 2024-05-19 NOTE — Telephone Encounter (Signed)
 Med refill request: estradiol  0.1 mg/gm vaginal cream. 1 applicator full vaginal 3 times weekly Last AEX: 12/27/22 -EB Next AEX:Not scheduled, MyChart message to patient to schedule AEX.  Last MMG (if hormonal med) 02/17/2024: BiRads 1 neg Refill authorized: Please Advise?

## 2024-05-21 MED ORDER — ESTRADIOL 0.01 % VA CREA: 1.0000 | TOPICAL_CREAM | VAGINAL | 0 refills | Status: AC

## 2024-06-19 ENCOUNTER — Ambulatory Visit: Admitting: Podiatry
